# Patient Record
Sex: Female | Born: 1935 | Race: White | Hispanic: No | Smoking: Never smoker
Health system: Southern US, Community
[De-identification: ages and names within clinical notes are randomized; demographics above are authoritative.]

## PROBLEM LIST (undated history)

## (undated) DIAGNOSIS — M1990 Unspecified osteoarthritis, unspecified site: Secondary | ICD-10-CM

## (undated) DIAGNOSIS — F341 Dysthymic disorder: Secondary | ICD-10-CM

## (undated) DIAGNOSIS — M6530 Trigger finger, unspecified finger: Secondary | ICD-10-CM

## (undated) DIAGNOSIS — F411 Generalized anxiety disorder: Secondary | ICD-10-CM

## (undated) DIAGNOSIS — M810 Age-related osteoporosis without current pathological fracture: Secondary | ICD-10-CM

## (undated) DIAGNOSIS — E039 Hypothyroidism, unspecified: Secondary | ICD-10-CM

## (undated) DIAGNOSIS — R739 Hyperglycemia, unspecified: Secondary | ICD-10-CM

## (undated) DIAGNOSIS — I440 Atrioventricular block, first degree: Secondary | ICD-10-CM

## (undated) DIAGNOSIS — I10 Essential (primary) hypertension: Secondary | ICD-10-CM

## (undated) DIAGNOSIS — C50919 Malignant neoplasm of unspecified site of unspecified female breast: Secondary | ICD-10-CM

## (undated) DIAGNOSIS — K5900 Constipation, unspecified: Secondary | ICD-10-CM

## (undated) DIAGNOSIS — E559 Vitamin D deficiency, unspecified: Secondary | ICD-10-CM

## (undated) HISTORY — DX: Essential (primary) hypertension: I10

## (undated) HISTORY — DX: Dysthymic disorder: F34.1

## (undated) HISTORY — DX: Generalized anxiety disorder: F41.1

## (undated) HISTORY — DX: Age-related osteoporosis without current pathological fracture: M81.0

## (undated) HISTORY — DX: Unspecified osteoarthritis, unspecified site: M19.90

## (undated) HISTORY — DX: Constipation, unspecified: K59.00

## (undated) HISTORY — DX: Malignant neoplasm of unspecified site of unspecified female breast: C50.919

## (undated) HISTORY — DX: Hyperglycemia, unspecified: R73.9

## (undated) HISTORY — DX: Atrioventricular block, first degree: I44.0

## (undated) HISTORY — DX: Trigger finger, unspecified finger: M65.30

## (undated) HISTORY — DX: Vitamin D deficiency, unspecified: E55.9

## (undated) HISTORY — DX: Hypothyroidism, unspecified: E03.9

---

## 1960-05-20 HISTORY — PX: ORIF FACIAL FRACTURE: SHX2118

## 1984-05-20 HISTORY — PX: MASTECTOMY: SHX3

## 1997-10-18 LAB — HM SIGMOIDOSCOPY

## 2000-01-08 ENCOUNTER — Encounter: Payer: Self-pay | Admitting: Internal Medicine

## 2000-01-08 ENCOUNTER — Encounter: Admission: RE | Admit: 2000-01-08 | Discharge: 2000-01-08 | Payer: Self-pay | Admitting: Internal Medicine

## 2000-03-25 ENCOUNTER — Other Ambulatory Visit: Admission: RE | Admit: 2000-03-25 | Discharge: 2000-03-25 | Payer: Self-pay | Admitting: Internal Medicine

## 2000-05-20 HISTORY — PX: CATARACT EXTRACTION BILATERAL W/ ANTERIOR VITRECTOMY: SHX1304

## 2000-06-06 LAB — HM DEXA SCAN

## 2001-01-16 ENCOUNTER — Encounter: Admission: RE | Admit: 2001-01-16 | Discharge: 2001-01-16 | Payer: Self-pay | Admitting: Internal Medicine

## 2001-01-16 ENCOUNTER — Encounter: Payer: Self-pay | Admitting: Internal Medicine

## 2005-08-13 ENCOUNTER — Encounter: Admission: RE | Admit: 2005-08-13 | Discharge: 2005-08-13 | Payer: Self-pay | Admitting: Internal Medicine

## 2006-09-16 ENCOUNTER — Encounter: Admission: RE | Admit: 2006-09-16 | Discharge: 2006-09-16 | Payer: Self-pay | Admitting: Internal Medicine

## 2007-09-18 ENCOUNTER — Encounter: Admission: RE | Admit: 2007-09-18 | Discharge: 2007-09-18 | Payer: Self-pay | Admitting: Internal Medicine

## 2008-09-26 ENCOUNTER — Encounter: Admission: RE | Admit: 2008-09-26 | Discharge: 2008-09-26 | Payer: Self-pay | Admitting: Internal Medicine

## 2008-09-26 LAB — HM MAMMOGRAPHY: HM Mammogram: NEGATIVE

## 2009-01-30 DIAGNOSIS — R739 Hyperglycemia, unspecified: Secondary | ICD-10-CM | POA: Insufficient documentation

## 2009-01-30 HISTORY — DX: Hyperglycemia, unspecified: R73.9

## 2011-07-31 DIAGNOSIS — H04129 Dry eye syndrome of unspecified lacrimal gland: Secondary | ICD-10-CM | POA: Diagnosis not present

## 2011-07-31 DIAGNOSIS — H40019 Open angle with borderline findings, low risk, unspecified eye: Secondary | ICD-10-CM | POA: Diagnosis not present

## 2012-01-30 DIAGNOSIS — Z23 Encounter for immunization: Secondary | ICD-10-CM | POA: Diagnosis not present

## 2012-06-02 DIAGNOSIS — I10 Essential (primary) hypertension: Secondary | ICD-10-CM | POA: Diagnosis not present

## 2012-06-02 DIAGNOSIS — C50919 Malignant neoplasm of unspecified site of unspecified female breast: Secondary | ICD-10-CM | POA: Diagnosis not present

## 2012-06-02 DIAGNOSIS — E039 Hypothyroidism, unspecified: Secondary | ICD-10-CM | POA: Diagnosis not present

## 2012-06-02 DIAGNOSIS — E559 Vitamin D deficiency, unspecified: Secondary | ICD-10-CM | POA: Diagnosis not present

## 2012-09-25 ENCOUNTER — Other Ambulatory Visit: Payer: Self-pay | Admitting: *Deleted

## 2012-09-25 DIAGNOSIS — M109 Gout, unspecified: Secondary | ICD-10-CM

## 2012-09-25 DIAGNOSIS — I10 Essential (primary) hypertension: Secondary | ICD-10-CM

## 2012-09-25 DIAGNOSIS — E039 Hypothyroidism, unspecified: Secondary | ICD-10-CM

## 2012-09-28 DIAGNOSIS — H353190 Nonexudative age-related macular degeneration, unspecified eye, stage unspecified: Secondary | ICD-10-CM | POA: Diagnosis not present

## 2012-09-28 DIAGNOSIS — Z961 Presence of intraocular lens: Secondary | ICD-10-CM | POA: Diagnosis not present

## 2012-10-05 ENCOUNTER — Ambulatory Visit (INDEPENDENT_AMBULATORY_CARE_PROVIDER_SITE_OTHER): Payer: Medicare Other | Admitting: Nurse Practitioner

## 2012-10-05 ENCOUNTER — Encounter: Payer: Self-pay | Admitting: Nurse Practitioner

## 2012-10-05 VITALS — BP 124/82 | HR 68 | Temp 98.6°F | Resp 14 | Wt 145.8 lb

## 2012-10-05 DIAGNOSIS — L723 Sebaceous cyst: Secondary | ICD-10-CM | POA: Diagnosis not present

## 2012-10-05 MED ORDER — SACCHAROMYCES BOULARDII 250 MG PO CAPS: 250.0000 mg | ORAL_CAPSULE | Freq: Two times a day (BID) | ORAL | Status: DC

## 2012-10-05 MED ORDER — CEPHALEXIN 250 MG PO CAPS: 250.0000 mg | ORAL_CAPSULE | Freq: Two times a day (BID) | ORAL | Status: DC

## 2012-10-05 NOTE — Patient Instructions (Addendum)
Take medications twice daily for 10 days May use heating pad (low heat) to area twice daily Please follow up in 2 weeks or before if needed or if fever occurs   Epodermal Cyst An epidermal cyst is sometimes called a sebaceous cyst, epidermal inclusion cyst, or infundibular cyst. These cysts usually contain a substance that looks "pasty" or "cheesy" and may have a bad smell. This substance is a protein called keratin. Epidermal cysts are usually found on the face, neck, or trunk. They may also occur in the vaginal area or other parts of the genitalia of both men and women. Epidermal cysts are usually small, painless, slow-growing bumps or lumps that move freely under the skin. It is important not to try to pop them. This may cause an infection and lead to tenderness and swelling. CAUSES  Epidermal cysts may be caused by a deep penetrating injury to the skin or a plugged hair follicle, often associated with acne. SYMPTOMS  Epidermal cysts can become inflamed and cause:  Redness.  Tenderness.  Increased temperature of the skin over the bumps or lumps.  Grayish-white, bad smelling material that drains from the bump or lump. DIAGNOSIS  Epidermal cysts are easily diagnosed by your caregiver during an exam. Rarely, a tissue sample (biopsy) may be taken to rule out other conditions that may resemble epidermal cysts. TREATMENT   Epidermal cysts often get better and disappear on their own. They are rarely ever cancerous.  If a cyst becomes infected, it may become inflamed and tender. This may require opening and draining the cyst. Treatment with antibiotics may be necessary. When the infection is gone, the cyst may be removed with minor surgery.  Small, inflamed cysts can often be treated with antibiotics or by injecting steroid medicines.  Sometimes, epidermal cysts become large and bothersome. If this happens, surgical removal in your caregiver's office may be necessary. HOME CARE  INSTRUCTIONS  Only take over-the-counter or prescription medicines as directed by your caregiver.  Take your antibiotics as directed. Finish them even if you start to feel better. SEEK MEDICAL CARE IF:   Your cyst becomes tender, red, or swollen.  Your condition is not improving or is getting worse.  You have any other questions or concerns. MAKE SURE YOU:  Understand these instructions.  Will watch your condition.  Will get help right away if you are not doing well or get worse. Document Released: 04/06/2004 Document Revised: 07/29/2011 Document Reviewed: 11/12/2010 [facility] Patient Information 2013 [place].

## 2012-10-05 NOTE — Progress Notes (Signed)
Patient ID: Diana Anthony, female   DOB: [birth year], 79 y.o.   MRN: [id number]   Allergies  Allergen Reactions  . Macrobid (Nitrofurantoin Macrocrystal)     Chief Complaint  Patient presents with  . sore on bottom and hurts to sit    HPI: Patient is a 79 y.o. female seen in the office today for painful bump on rectum. Symptoms started 4 days ago and has just gotten worse. Reports she has had a history of hemorrhoids before but this is not it. No blood in stool. No pain when having a BM. Pain when sitting. Has not tried anything because she can not see what is going on.  No fevers or chills   Review of Systems:  Review of Systems  Constitutional: Negative for fever and chills.  Respiratory: Negative for shortness of breath.   Cardiovascular: Negative for chest pain.  Gastrointestinal: Negative for abdominal pain, diarrhea and constipation.  Skin:       Painful area on skin above rectum  Neurological: Negative for weakness.     Past Medical History  Diagnosis Date  . Unspecified vitamin D deficiency   . Other abnormal blood chemistry   . Osteoarthrosis, unspecified whether generalized or localized, unspecified site   . Trigger finger (acquired)   . First degree atrioventricular block   . Anxiety state, unspecified   . Unspecified constipation   . Osteoporosis, unspecified   . Malignant neoplasm of breast (female), unspecified site   . Unspecified hypothyroidism   . Dysthymic disorder   . Unspecified essential hypertension    Past Surgical History  Procedure Laterality Date  . Orif facial fracture  1962  . Mastectomy Left 1986  . Eye surgery     Social History:   reports that she has never smoked. She does not have any smokeless tobacco history on file. She reports that she does not drink alcohol or use illicit drugs.  No family history on file.  Medications: Patient's Medications  New Prescriptions   No medications on file  Previous Medications   AMLODIPINE-BENAZEPRIL (LOTREL) 5-10 MG PER CAPSULE    Take 1 capsule by mouth daily. For blood pressure   CALCIUM CARB-CHOLECALCIFEROL (CALCIUM + D3) 600-200 MG-UNIT TABS    Take one tablet once daily for calcium   CHLORDIAZEPOXIDE (LIBRIUM) 10 MG CAPSULE    Take one capsule daily as needed for rest or nerves   CHOLECALCIFEROL (VITAMIN D) 1000 UNITS TABLET    Take 1,000 Units by mouth daily.   HYDROCHLOROTHIAZIDE (HYDRODIURIL) 25 MG TABLET    Take 25 mg by mouth daily. For bp   LECITHIN 1200 MG CAPS    Take by mouth. Once daily   LEVOTHYROXINE (SYNTHROID, LEVOTHROID) 88 MCG TABLET    Take one tablet by mouth once daily for thyroid   METOPROLOL SUCCINATE (TOPROL-XL) 50 MG 24 HR TABLET    Take 50 mg by mouth daily. To control blood pressure   VITAMIN B-12 (CYANOCOBALAMIN) 100 MCG TABLET    Take 50 mcg by mouth daily.  Modified Medications   No medications on file  Discontinued Medications   No medications on file     Physical Exam:  Filed Vitals:   10/05/12 1014  BP: 124/82  Pulse: 68  Temp: 98.6 F (37 C)  TempSrc: Oral  Resp: 14  Weight: 145 lb 12.8 oz (66.134 kg)    Physical Exam  Constitutional: She appears well-developed and well-nourished. No distress.  Cardiovascular: Normal rate and regular rhythm.  Pulmonary/Chest: Effort normal.  Abdominal: Soft. Bowel sounds are normal.  Genitourinary: Rectum normal.  Skin: She is not diaphoretic.     Red tender flat area above rectum in sacral area No drainage      Assessment/Plan Cyst- Will put on keflex 250 mg PO BID for 10 days with florastore BID Can use heating pad  To follow up in 2 weeks or before if having fever, chills, increased pain, drainage

## 2012-10-15 ENCOUNTER — Other Ambulatory Visit: Payer: Self-pay | Admitting: Geriatric Medicine

## 2012-10-15 MED ORDER — LEVOTHYROXINE SODIUM 88 MCG PO TABS: ORAL_TABLET | ORAL | Status: DC

## 2012-10-26 ENCOUNTER — Encounter: Payer: Self-pay | Admitting: *Deleted

## 2012-10-27 ENCOUNTER — Ambulatory Visit (INDEPENDENT_AMBULATORY_CARE_PROVIDER_SITE_OTHER): Payer: Medicare Other | Admitting: Nurse Practitioner

## 2012-10-27 ENCOUNTER — Encounter: Payer: Self-pay | Admitting: Nurse Practitioner

## 2012-10-27 VITALS — BP 140/86 | HR 78 | Temp 98.6°F | Resp 14 | Ht 60.0 in | Wt 145.0 lb

## 2012-10-27 DIAGNOSIS — L723 Sebaceous cyst: Secondary | ICD-10-CM

## 2012-10-27 NOTE — Progress Notes (Signed)
Patient ID: Diana Anthony, female   DOB: 1916-10-14, 79 y.o.   MRN: [id number]   Allergies  Allergen Reactions  . Macrobid (Nitrofurantoin Macrocrystal)     Chief Complaint  Patient presents with  . 2 wk follow up    sore on bottom    HPI: Patient is a 79 y.o. female seen in the office today for follow up on sebaceous cyst Reports the pain is better. Is able to sit down without it being uncomfortable. Took all antibiotics Rx. Cyst opened up and drained currently no drainage. No fever or chills. Took neosporin as well.   Review of Systems:  Review of Systems  Constitutional: Negative for fever, chills and malaise/fatigue.  Respiratory: Negative for shortness of breath.   Cardiovascular: Negative for chest pain.  Gastrointestinal: Negative for abdominal pain, diarrhea and constipation.  Musculoskeletal: Negative for myalgias.  Skin: Negative for itching and rash.  Neurological: Negative for weakness.     Past Medical History  Diagnosis Date  . Unspecified vitamin D deficiency   . Other abnormal blood chemistry   . Osteoarthrosis, unspecified whether generalized or localized, unspecified site   . Trigger finger (acquired)   . First degree atrioventricular block   . Anxiety state, unspecified   . Unspecified constipation   . Osteoporosis, unspecified   . Malignant neoplasm of breast (female), unspecified site   . Unspecified hypothyroidism   . Dysthymic disorder   . Unspecified essential hypertension    Past Surgical History  Procedure Laterality Date  . Orif facial fracture  1962  . Mastectomy Left 1986  . Eye surgery     Social History:   reports that she has never smoked. She does not have any smokeless tobacco history on file. She reports that she does not drink alcohol or use illicit drugs.  No family history on file.  Medications: Patient's Medications  New Prescriptions   No medications on file  Previous Medications   AMLODIPINE-BENAZEPRIL (LOTREL) 5-10  MG PER CAPSULE    Take 1 capsule by mouth daily. For blood pressure   CALCIUM CARB-CHOLECALCIFEROL (CALCIUM + D3) 600-200 MG-UNIT TABS    Take one tablet once daily for calcium   CEPHALEXIN (KEFLEX) 250 MG CAPSULE    Take 1 capsule (250 mg total) by mouth 2 (two) times daily.   CHLORDIAZEPOXIDE (LIBRIUM) 10 MG CAPSULE    Take one capsule daily as needed for rest or nerves   CHOLECALCIFEROL (VITAMIN D) 1000 UNITS TABLET    Take 1,000 Units by mouth daily.   HYDROCHLOROTHIAZIDE (HYDRODIURIL) 25 MG TABLET    Take 25 mg by mouth daily. For bp   LECITHIN 1200 MG CAPS    Take by mouth. Once daily   LEVOTHYROXINE (SYNTHROID, LEVOTHROID) 88 MCG TABLET    Take one tablet by mouth once daily for thyroid.   LEVOTHYROXINE (SYNTHROID, LEVOTHROID) 88 MCG TABLET    Take one tablet by mouth once daily for thyroid   METOPROLOL SUCCINATE (TOPROL-XL) 50 MG 24 HR TABLET    Take 50 mg by mouth daily. To control blood pressure   SACCHAROMYCES BOULARDII (FLORASTOR) 250 MG CAPSULE    Take 1 capsule (250 mg total) by mouth 2 (two) times daily.   VITAMIN B-12 (CYANOCOBALAMIN) 100 MCG TABLET    Take 50 mcg by mouth daily.  Modified Medications   No medications on file  Discontinued Medications   No medications on file     Physical Exam:  Filed Vitals:   10/27/12 1113  BP: 140/86  Pulse: 78  Temp: 98.6 F (37 C)  TempSrc: Oral  Resp: 14  Height: 5' (1.524 m)  Weight: 145 lb (65.772 kg)  SpO2: 96%    Physical Exam  Vitals reviewed. Constitutional: She appears well-developed and well-nourished. No distress.  Cardiovascular: Normal rate, regular rhythm and normal heart sounds.   Pulmonary/Chest: Effort normal and breath sounds normal.  Abdominal: Soft. Bowel sounds are normal.  Skin: Skin is warm and dry. She is not diaphoretic.  Cyst has improved area no longer red or tender  Psychiatric: She has a normal mood and affect.       Assessment/Plan Cyst- has resolved- to follow up as needed

## 2012-11-27 ENCOUNTER — Other Ambulatory Visit: Payer: Medicare Other

## 2012-11-27 DIAGNOSIS — I10 Essential (primary) hypertension: Secondary | ICD-10-CM

## 2012-11-27 DIAGNOSIS — E039 Hypothyroidism, unspecified: Secondary | ICD-10-CM | POA: Diagnosis not present

## 2012-11-27 DIAGNOSIS — M109 Gout, unspecified: Secondary | ICD-10-CM

## 2012-11-28 LAB — COMPREHENSIVE METABOLIC PANEL
ALT: 11 IU/L (ref 0–32)
AST: 21 IU/L (ref 0–40)
Albumin/Globulin Ratio: 1.6 (ref 1.1–2.5)
Alkaline Phosphatase: 72 IU/L (ref 39–117)
CO2: 23 mmol/L (ref 18–29)
Calcium: 9.5 mg/dL (ref 8.6–10.2)
GFR calc non Af Amer: 33 mL/min/{1.73_m2} — ABNORMAL LOW (ref >59)
Potassium: 4.3 mmol/L (ref 3.5–5.2)
Sodium: 140 mmol/L (ref 134–144)

## 2012-11-28 LAB — LIPID PANEL
Chol/HDL Ratio: 3 ratio units (ref 0.0–4.4)
HDL: 54 mg/dL (ref >39)
VLDL Cholesterol Cal: 25 mg/dL (ref 5–40)

## 2012-11-28 LAB — TSH: TSH: 3.04 u[IU]/mL (ref 0.450–4.500)

## 2012-11-30 ENCOUNTER — Encounter: Payer: Self-pay | Admitting: *Deleted

## 2012-12-01 ENCOUNTER — Encounter: Payer: Self-pay | Admitting: Internal Medicine

## 2012-12-01 ENCOUNTER — Ambulatory Visit (INDEPENDENT_AMBULATORY_CARE_PROVIDER_SITE_OTHER): Payer: Medicare Other | Admitting: Internal Medicine

## 2012-12-01 VITALS — BP 138/84 | HR 84 | Temp 98.1°F | Resp 13 | Ht 60.0 in | Wt 149.0 lb

## 2012-12-01 DIAGNOSIS — E559 Vitamin D deficiency, unspecified: Secondary | ICD-10-CM | POA: Insufficient documentation

## 2012-12-01 DIAGNOSIS — Z853 Personal history of malignant neoplasm of breast: Secondary | ICD-10-CM | POA: Insufficient documentation

## 2012-12-01 DIAGNOSIS — C50919 Malignant neoplasm of unspecified site of unspecified female breast: Secondary | ICD-10-CM

## 2012-12-01 DIAGNOSIS — L0232 Furuncle of buttock: Secondary | ICD-10-CM

## 2012-12-01 DIAGNOSIS — R7309 Other abnormal glucose: Secondary | ICD-10-CM

## 2012-12-01 DIAGNOSIS — E039 Hypothyroidism, unspecified: Secondary | ICD-10-CM

## 2012-12-01 DIAGNOSIS — F411 Generalized anxiety disorder: Secondary | ICD-10-CM | POA: Diagnosis not present

## 2012-12-01 DIAGNOSIS — E785 Hyperlipidemia, unspecified: Secondary | ICD-10-CM

## 2012-12-01 DIAGNOSIS — L0233 Carbuncle of buttock: Secondary | ICD-10-CM

## 2012-12-01 DIAGNOSIS — R739 Hyperglycemia, unspecified: Secondary | ICD-10-CM

## 2012-12-01 DIAGNOSIS — I10 Essential (primary) hypertension: Secondary | ICD-10-CM

## 2012-12-01 MED ORDER — TRIAMCINOLONE ACETONIDE 0.1 % EX CREA: TOPICAL_CREAM | CUTANEOUS | Status: DC

## 2012-12-01 MED ORDER — LEVOTHYROXINE SODIUM 88 MCG PO TABS: ORAL_TABLET | ORAL | Status: DC

## 2012-12-01 NOTE — Patient Instructions (Signed)
Continue current medications. 

## 2012-12-01 NOTE — Progress Notes (Signed)
Subjective:    Patient ID: Diana Anthony, female    DOB: 10-11-1916, 79 y.o.   MRN: [id number]  HPI Had a cyst on her her buttocks. Started over a month ago. Now better. Neice opened it up. Used triamcinolone acetonide 0.1% cream. Would like a refill.  Unspecified vitamin D deficiency: stable  Anxiety state, unspecified: chronic. Unchanged.  Malignant neoplasm of breast (female), unspecified site: remote history. No relapse.  Unspecified hypothyroidism: stable  Unspecified essential hypertension: controlled  Hyperglycemia: controlled  Boil of buttock : resolved  - Plan: triamcinolone cream (KENALOG) 0.1 %   Current Outpatient Prescriptions on File Prior to Visit  Medication Sig Dispense Refill  . amLODipine-benazepril (LOTREL) 5-10 MG per capsule Take 1 capsule by mouth daily. For blood pressure      . Calcium Carb-Cholecalciferol (CALCIUM + D3) 600-200 MG-UNIT TABS Take one tablet once daily for calcium      . chlordiazePOXIDE (LIBRIUM) 10 MG capsule Take one capsule daily as needed for rest or nerves      . cholecalciferol (VITAMIN D) 1000 UNITS tablet Take 1,000 Units by mouth daily.      . hydrochlorothiazide (HYDRODIURIL) 25 MG tablet Take 25 mg by mouth daily. For bp      . Lecithin 1200 MG CAPS Take by mouth. Once daily      . levothyroxine (SYNTHROID, LEVOTHROID) 88 MCG tablet Take one tablet by mouth once daily for thyroid.  90 tablet  3  . levothyroxine (SYNTHROID, LEVOTHROID) 88 MCG tablet Take one tablet by mouth once daily for thyroid  90 tablet  3  . metoprolol succinate (TOPROL-XL) 50 MG 24 hr tablet Take 50 mg by mouth daily. To control blood pressure      . vitamin B-12 (CYANOCOBALAMIN) 100 MCG tablet Take 50 mcg by mouth daily.            Review of Systems  Constitutional: Negative.   HENT: Negative.   Eyes: Negative.   Respiratory: Negative.   Cardiovascular: Negative.  Negative for chest pain, palpitations and leg swelling.  Gastrointestinal: Positive  for constipation.  Endocrine: Negative for cold intolerance, heat intolerance, polydipsia, polyphagia and polyuria.       Elevated glucose to 120 in 2010.  Genitourinary:       Leakage.  Musculoskeletal: Negative.   Skin: Negative.   Allergic/Immunologic: Negative.   Neurological: Negative.   Hematological: Negative.   Psychiatric/Behavioral: Positive for sleep disturbance and dysphoric mood. The patient is nervous/anxious.        Objective:BP 138/84  Pulse 84  Temp(Src) 98.1 F (36.7 C) (Oral)  Resp 13  Ht 5' (1.524 m)  Wt 149 lb (67.586 kg)  BMI 29.1 kg/m2    Physical Exam  Constitutional: She is oriented to person, place, and time. She appears well-nourished. No distress.  Frail.  HENT:  Head: Normocephalic and atraumatic.  Right Ear: External ear normal.  Nose: Nose normal.  Eyes:  Corrective lenses.  Neck: Neck supple. No JVD present. No tracheal deviation present. No thyromegaly present.  Cardiovascular: Normal rate, regular rhythm, normal heart sounds and intact distal pulses.  Exam reveals no gallop and no friction rub.   No murmur heard. Varicose veins  Pulmonary/Chest: Effort normal and breath sounds normal. No respiratory distress. She has no wheezes. She has no rales. She exhibits no tenderness.  Left mastectomy  Abdominal: Bowel sounds are normal. She exhibits no distension and no mass. There is no tenderness.  Musculoskeletal: Normal range of motion. She  exhibits edema. She exhibits no tenderness.  Diffuse OA changes in hands.  Lymphadenopathy:    She has no cervical adenopathy.  Neurological: She is alert and oriented to person, place, and time. No cranial nerve deficit. Coordination normal.  Skin: No rash noted. No erythema. No pallor.  Psychiatric: She has a normal mood and affect. Her behavior is normal. Thought content normal.       Assessment & Plan:  Unspecified vitamin D deficiency: continue supplements  Anxiety state, unspecified:  unuchanged  Malignant neoplasm of breast (female), unspecified site: non relapsse  Unspecified hypothyroidism - Plan: levothyroxine (SYNTHROID, LEVOTHROID) 88 MCG tablet, TSH  Unspecified essential hypertension - Plan: Comprehensive metabolic panel  Hyperglycemia - Plan: Hemoglobin A1c  Boil of buttock - Plan: triamcinolone cream (KENALOG) 0.1 %  Other and unspecified hyperlipidemia - Plan: Lipid panel

## 2012-12-16 ENCOUNTER — Other Ambulatory Visit: Payer: Self-pay | Admitting: Internal Medicine

## 2012-12-16 ENCOUNTER — Encounter: Payer: Self-pay | Admitting: Internal Medicine

## 2012-12-16 ENCOUNTER — Ambulatory Visit (INDEPENDENT_AMBULATORY_CARE_PROVIDER_SITE_OTHER): Payer: Medicare Other | Admitting: Internal Medicine

## 2012-12-16 VITALS — BP 112/62 | HR 59 | Temp 98.0°F | Resp 14 | Ht 60.0 in | Wt 148.0 lb

## 2012-12-16 DIAGNOSIS — N390 Urinary tract infection, site not specified: Secondary | ICD-10-CM

## 2012-12-16 LAB — POCT URINALYSIS DIPSTICK
Blood, UA: 50
Glucose, UA: NEGATIVE
Nitrite, UA: NEGATIVE
Protein, UA: 30
Urobilinogen, UA: NEGATIVE

## 2012-12-16 MED ORDER — SULFAMETHOXAZOLE-TMP DS 800-160 MG PO TABS: 1.0000 | ORAL_TABLET | Freq: Two times a day (BID) | ORAL | Status: DC

## 2012-12-16 MED ORDER — PHENAZOPYRIDINE HCL 100 MG PO TABS: 100.0000 mg | ORAL_TABLET | Freq: Two times a day (BID) | ORAL | Status: DC | PRN

## 2012-12-16 MED ORDER — SACCHAROMYCES BOULARDII 250 MG PO CAPS: 250.0000 mg | ORAL_CAPSULE | Freq: Two times a day (BID) | ORAL | Status: DC

## 2012-12-16 NOTE — Progress Notes (Signed)
Patient ID: MIRABELLE DACY, female   DOB: May 23, 1916, 79 y.o.   MRN: [id number]  Chief Complaint  Patient presents with  . Urinary Tract Infection   Allergies  Allergen Reactions  . Macrobid (Nitrofurantoin Macrocrystal)     HPI- 79 y/o female patient is here with complaints of burning and dysuria with urination for 4 days. She denies any  Hematuria or abdominal pain. No nausea or vomiting Appetite is good  Ros- No fever or chills Has increased urinary frequency No problem with bowel movement No confusion or headache No falls No flank pain  BP 112/62  Pulse 59  Temp(Src) 98 F (36.7 C) (Oral)  Resp 14  Ht 5' (1.524 m)  Wt 148 lb (67.132 kg)  BMI 28.9 kg/m2  gen- elderly female in NAD HEENT- no pallor, no icteurs, mmm cvs- n s1,s2, rrr abdo- bs+, soft, non tender, no flank pain, no suprapubic tenderness Ext- able to move all 4, uses a cane  Labs- U/a- cloudy, amber, wbc > 75 with 30 rbc Sent for cultrue  A/p-  uti- will start her on bactrim ds 1 tab q12h for a week, send urine culture. Encourage hydration and will also provide cranberry capsule daily to prevent recurrence.to take florastor for probiotic and will provide pyridium bid prn with meals to help with dysuria. Reassess if no imporvement

## 2012-12-16 NOTE — Addendum Note (Signed)
Addended by: Jackelyn Knife F on: 12/16/2012 04:50 PM   Modules accepted: Orders

## 2012-12-16 NOTE — Addendum Note (Signed)
Addended by: Jearld Adjutant on: 12/16/2012 04:52 PM   Modules accepted: Orders

## 2012-12-18 LAB — URINE CULTURE

## 2013-01-28 DIAGNOSIS — Z23 Encounter for immunization: Secondary | ICD-10-CM | POA: Diagnosis not present

## 2013-02-15 ENCOUNTER — Ambulatory Visit: Payer: Medicare Other | Admitting: Internal Medicine

## 2013-02-18 ENCOUNTER — Other Ambulatory Visit: Payer: Self-pay | Admitting: *Deleted

## 2013-06-03 ENCOUNTER — Other Ambulatory Visit: Payer: Self-pay | Admitting: Internal Medicine

## 2013-06-18 ENCOUNTER — Other Ambulatory Visit: Payer: Medicare Other

## 2013-06-18 DIAGNOSIS — E039 Hypothyroidism, unspecified: Secondary | ICD-10-CM | POA: Diagnosis not present

## 2013-06-18 DIAGNOSIS — E785 Hyperlipidemia, unspecified: Secondary | ICD-10-CM | POA: Diagnosis not present

## 2013-06-18 DIAGNOSIS — I10 Essential (primary) hypertension: Secondary | ICD-10-CM

## 2013-06-18 DIAGNOSIS — R739 Hyperglycemia, unspecified: Secondary | ICD-10-CM

## 2013-06-18 DIAGNOSIS — R7309 Other abnormal glucose: Secondary | ICD-10-CM | POA: Diagnosis not present

## 2013-06-19 LAB — COMPREHENSIVE METABOLIC PANEL
A/G RATIO: 1.6 (ref 1.1–2.5)
ALBUMIN: 4.4 g/dL (ref 3.2–4.6)
ALK PHOS: 84 IU/L (ref 39–117)
ALT: 10 IU/L (ref 0–32)
AST: 22 IU/L (ref 0–40)
BILIRUBIN TOTAL: 0.4 mg/dL (ref 0.0–1.2)
BUN / CREAT RATIO: 15 (ref 11–26)
BUN: 23 mg/dL (ref 10–36)
CO2: 25 mmol/L (ref 18–29)
CREATININE: 1.54 mg/dL — AB (ref 0.57–1.00)
Calcium: 9.8 mg/dL (ref 8.7–10.3)
Chloride: 99 mmol/L (ref 97–108)
GFR calc non Af Amer: 29 mL/min/{1.73_m2} — ABNORMAL LOW (ref >59)
GFR, EST AFRICAN AMERICAN: 33 mL/min/{1.73_m2} — AB (ref >59)
GLOBULIN, TOTAL: 2.8 g/dL (ref 1.5–4.5)
Glucose: 88 mg/dL (ref 65–99)
Potassium: 3.8 mmol/L (ref 3.5–5.2)
Sodium: 143 mmol/L (ref 134–144)
Total Protein: 7.2 g/dL (ref 6.0–8.5)

## 2013-06-19 LAB — TSH: TSH: 3.77 u[IU]/mL (ref 0.450–4.500)

## 2013-06-19 LAB — LIPID PANEL
CHOLESTEROL TOTAL: 186 mg/dL (ref 100–199)
Chol/HDL Ratio: 3 ratio units (ref 0.0–4.4)
HDL: 61 mg/dL (ref >39)
LDL Calculated: 99 mg/dL (ref 0–99)
TRIGLYCERIDES: 129 mg/dL (ref 0–149)
VLDL Cholesterol Cal: 26 mg/dL (ref 5–40)

## 2013-06-19 LAB — HEMOGLOBIN A1C
Est. average glucose Bld gHb Est-mCnc: 128 mg/dL
Hgb A1c MFr Bld: 6.1 % — ABNORMAL HIGH (ref 4.8–5.6)

## 2013-06-22 ENCOUNTER — Ambulatory Visit (INDEPENDENT_AMBULATORY_CARE_PROVIDER_SITE_OTHER): Payer: Medicare Other | Admitting: Internal Medicine

## 2013-06-22 VITALS — BP 126/78 | HR 88 | Temp 98.2°F | Wt 149.0 lb

## 2013-06-22 DIAGNOSIS — R7309 Other abnormal glucose: Secondary | ICD-10-CM | POA: Diagnosis not present

## 2013-06-22 DIAGNOSIS — I10 Essential (primary) hypertension: Secondary | ICD-10-CM | POA: Diagnosis not present

## 2013-06-22 DIAGNOSIS — E039 Hypothyroidism, unspecified: Secondary | ICD-10-CM | POA: Diagnosis not present

## 2013-06-22 DIAGNOSIS — R739 Hyperglycemia, unspecified: Secondary | ICD-10-CM

## 2013-06-22 NOTE — Patient Instructions (Signed)
Continue current medications. 

## 2013-06-22 NOTE — Progress Notes (Signed)
Patient ID: Diana Anthony, female   DOB: [birth year], 80 y.o.   MRN: [id number]    Location:    [facility]  Place of Service:  OFFICE    Allergies  Allergen Reactions  . Macrobid [Nitrofurantoin Macrocrystal]     Chief Complaint  Patient presents with  . Medical Managment of Chronic Issues    6 month follow-up, labs from 06/18/2013 printed    HPI:  Hyperglycemia - Noted 01/30/2009. Glucose 120mg %.  Unspecified essential hypertension: controlled  Unspecified hypothyroidism: compensated    Medications: Patient's Medications  New Prescriptions   No medications on file  Previous Medications   AMLODIPINE-BENAZEPRIL (LOTREL) 5-10 MG PER CAPSULE    TAKE 1 CAPSULE DAILY TO    CONTROL BLOOD PRESSURE   CALCIUM CARB-CHOLECALCIFEROL (CALCIUM + D3) 600-200 MG-UNIT TABS    Take one tablet once daily for calcium   CHLORDIAZEPOXIDE (LIBRIUM) 10 MG CAPSULE    Take one capsule daily as needed for rest or nerves   CHOLECALCIFEROL (VITAMIN D) 1000 UNITS TABLET    Take 1,000 Units by mouth daily.   HYDROCHLOROTHIAZIDE (HYDRODIURIL) 25 MG TABLET    Take 25 mg by mouth daily. For bp   LECITHIN 1200 MG CAPS    Take by mouth. Once daily   LEVOTHYROXINE (SYNTHROID, LEVOTHROID) 88 MCG TABLET    Take one tablet by mouth once daily for thyroid   METOPROLOL SUCCINATE (TOPROL-XL) 50 MG 24 HR TABLET    Take 50 mg by mouth daily. To control blood pressure   TRIAMCINOLONE CREAM (KENALOG) 0.1 %    Apply twice daily as needed to irritated area.   VITAMIN B-12 (CYANOCOBALAMIN) 100 MCG TABLET    Take 50 mcg by mouth daily.  Modified Medications   No medications on file  Discontinued Medications   PHENAZOPYRIDINE (PYRIDIUM) 100 MG TABLET    Take 1 tablet (100 mg total) by mouth 2 (two) times daily as needed for pain.   SACCHAROMYCES BOULARDII (FLORASTOR) 250 MG CAPSULE    Take 1 capsule (250 mg total) by mouth 2 (two) times daily.   SULFAMETHOXAZOLE-TRIMETHOPRIM (BACTRIM DS) 800-160 MG PER TABLET    Take 1 tablet by  mouth 2 (two) times daily.     Review of Systems  Constitutional: Negative.   HENT: Negative.   Eyes: Negative.   Respiratory: Negative.   Cardiovascular: Negative.  Negative for chest pain, palpitations and leg swelling.  Gastrointestinal: Positive for constipation.  Endocrine: Negative for cold intolerance, heat intolerance, polydipsia, polyphagia and polyuria.       Elevated glucose to 120 in 2010.  Genitourinary:       Leakage.  Musculoskeletal: Negative.   Skin: Negative.   Allergic/Immunologic: Negative.   Neurological: Negative.   Hematological: Negative.   Psychiatric/Behavioral: Positive for sleep disturbance and dysphoric mood. The patient is nervous/anxious.     Filed Vitals:   06/22/13 1326  BP: 126/78  Pulse: 88  Temp: 98.2 F (36.8 C)  TempSrc: Oral  Weight: 149 lb (67.586 kg)  SpO2: 95%   Physical Exam  Constitutional: She is oriented to person, place, and time. She appears well-nourished. No distress.  Frail.  HENT:  Head: Normocephalic and atraumatic.  Right Ear: External ear normal.  Nose: Nose normal.  Eyes:  Corrective lenses.  Neck: Neck supple. No JVD present. No tracheal deviation present. No thyromegaly present.  Cardiovascular: Normal rate, regular rhythm, normal heart sounds and intact distal pulses.  Exam reveals no gallop and no friction rub.  No murmur heard. Varicose veins  Pulmonary/Chest: Effort normal and breath sounds normal. No respiratory distress. She has no wheezes. She has no rales. She exhibits no tenderness.  Left mastectomy  Abdominal: Bowel sounds are normal. She exhibits no distension and no mass. There is no tenderness.  Musculoskeletal: Normal range of motion. She exhibits no edema and no tenderness.  Diffuse OA changes in hands.  Lymphadenopathy:    She has no cervical adenopathy.  Neurological: She is alert and oriented to person, place, and time. No cranial nerve deficit. Coordination normal.  Skin: No rash noted.  No erythema. No pallor.  Psychiatric: She has a normal mood and affect. Her behavior is normal. Thought content normal.     Labs reviewed: Office Visit on 06/22/2013  Component Date Value Range Status  . HM Mammogram 09/26/2008 Neg    Final  . HM Sigmoidoscopy 10/18/1997 Dr.Ladarius Seubert-Internal Hemorrhoids   Final  . HM Dexa Scan 06/06/2000 Mild Osteoporosis   Final  Appointment on 06/18/2013  Component Date Value Range Status  . Cholesterol, Total 06/18/2013 186  100 - 199 mg/dL Final  . Triglycerides 06/18/2013 129  0 - 149 mg/dL Final  . HDL 06/18/2013 61  >39 mg/dL Final   Comment: According to ATP-III Guidelines, HDL-C >59 mg/dL is considered a                          negative risk factor for CHD.  Marland Kitchen VLDL Cholesterol Cal 06/18/2013 26  5 - 40 mg/dL Final  . LDL Calculated 06/18/2013 99  0 - 99 mg/dL Final  . Chol/HDL Ratio 06/18/2013 3.0  0.0 - 4.4 ratio units Final   Comment:                                   T. Chol/HDL Ratio                                                                      Men  Women                                                        1/2 Avg.Risk  3.4    3.3                                                            Avg.Risk  5.0    4.4                                                         2X Avg.Risk  9.6    7.1  3X Avg.Risk 23.4   11.0  . TSH 06/18/2013 3.770  0.450 - 4.500 uIU/mL Final  . Glucose 06/18/2013 88  65 - 99 mg/dL Final  . BUN 06/18/2013 23  10 - 36 mg/dL Final  . Creatinine, Ser 06/18/2013 1.54* 0.57 - 1.00 mg/dL Final  . GFR calc non Af Amer 06/18/2013 29* >59 mL/min/1.73 Final  . GFR calc Af Amer 06/18/2013 33* >59 mL/min/1.73 Final  . BUN/Creatinine Ratio 06/18/2013 15  11 - 26 Final  . Sodium 06/18/2013 143  134 - 144 mmol/L Final  . Potassium 06/18/2013 3.8  3.5 - 5.2 mmol/L Final  . Chloride 06/18/2013 99  97 - 108 mmol/L Final  . CO2 06/18/2013 25  18 - 29 mmol/L Final  .  Calcium 06/18/2013 9.8  8.7 - 10.3 mg/dL Final                 **Please note reference interval change**  . Total Protein 06/18/2013 7.2  6.0 - 8.5 g/dL Final  . Albumin 06/18/2013 4.4  3.2 - 4.6 g/dL Final  . Globulin, Total 06/18/2013 2.8  1.5 - 4.5 g/dL Final  . Albumin/Globulin Ratio 06/18/2013 1.6  1.1 - 2.5 Final  . Total Bilirubin 06/18/2013 0.4  0.0 - 1.2 mg/dL Final  . Alkaline Phosphatase 06/18/2013 84  39 - 117 IU/L Final  . AST 06/18/2013 22  0 - 40 IU/L Final  . ALT 06/18/2013 10  0 - 32 IU/L Final  . Hemoglobin A1C 06/18/2013 6.1* 4.8 - 5.6 % Final   Comment:          Increased risk for diabetes: 5.7 - 6.4                                   Diabetes: >6.4                                   Glycemic control for adults with diabetes: <7.0  . Estimated average glucose 06/18/2013 128   Final      Assessment/Plan  Hyperglycemia - Noted 01/30/2009. Glucose 120mg %. Most recent lab is normal  Unspecified essential hypertension: controlled  Unspecified hypothyroidism; compensated

## 2013-11-11 DIAGNOSIS — H353190 Nonexudative age-related macular degeneration, unspecified eye, stage unspecified: Secondary | ICD-10-CM | POA: Diagnosis not present

## 2013-11-11 DIAGNOSIS — Z961 Presence of intraocular lens: Secondary | ICD-10-CM | POA: Diagnosis not present

## 2013-12-02 ENCOUNTER — Other Ambulatory Visit: Payer: Self-pay | Admitting: Internal Medicine

## 2013-12-03 ENCOUNTER — Encounter: Payer: Self-pay | Admitting: *Deleted

## 2013-12-27 ENCOUNTER — Other Ambulatory Visit: Payer: Medicare Other

## 2013-12-27 DIAGNOSIS — I10 Essential (primary) hypertension: Secondary | ICD-10-CM

## 2013-12-27 DIAGNOSIS — R7309 Other abnormal glucose: Secondary | ICD-10-CM | POA: Diagnosis not present

## 2013-12-27 DIAGNOSIS — R739 Hyperglycemia, unspecified: Secondary | ICD-10-CM

## 2013-12-28 LAB — COMPREHENSIVE METABOLIC PANEL
ALBUMIN: 3.9 g/dL (ref 3.2–4.6)
ALK PHOS: 75 IU/L (ref 39–117)
ALT: 11 IU/L (ref 0–32)
AST: 21 IU/L (ref 0–40)
Albumin/Globulin Ratio: 1.6 (ref 1.1–2.5)
BUN / CREAT RATIO: 17 (ref 11–26)
BUN: 23 mg/dL (ref 10–36)
CHLORIDE: 102 mmol/L (ref 97–108)
CO2: 25 mmol/L (ref 18–29)
Calcium: 9.3 mg/dL (ref 8.7–10.3)
Creatinine, Ser: 1.33 mg/dL — ABNORMAL HIGH (ref 0.57–1.00)
GFR calc Af Amer: 39 mL/min/{1.73_m2} — ABNORMAL LOW (ref >59)
GFR calc non Af Amer: 34 mL/min/{1.73_m2} — ABNORMAL LOW (ref >59)
GLUCOSE: 98 mg/dL (ref 65–99)
Globulin, Total: 2.4 g/dL (ref 1.5–4.5)
Potassium: 3.8 mmol/L (ref 3.5–5.2)
Sodium: 141 mmol/L (ref 134–144)
Total Bilirubin: 0.3 mg/dL (ref 0.0–1.2)
Total Protein: 6.3 g/dL (ref 6.0–8.5)

## 2013-12-28 LAB — HEMOGLOBIN A1C
ESTIMATED AVERAGE GLUCOSE: 140 mg/dL
HEMOGLOBIN A1C: 6.5 % — AB (ref 4.8–5.6)

## 2013-12-28 LAB — MICROALBUMIN, URINE: MICROALBUM., U, RANDOM: 34.3 ug/mL — AB (ref 0.0–17.0)

## 2013-12-28 LAB — LIPID PANEL
CHOLESTEROL TOTAL: 187 mg/dL (ref 100–199)
Chol/HDL Ratio: 3.5 ratio units (ref 0.0–4.4)
HDL: 54 mg/dL (ref >39)
LDL Calculated: 109 mg/dL — ABNORMAL HIGH (ref 0–99)
Triglycerides: 119 mg/dL (ref 0–149)
VLDL CHOLESTEROL CAL: 24 mg/dL (ref 5–40)

## 2013-12-29 ENCOUNTER — Ambulatory Visit (INDEPENDENT_AMBULATORY_CARE_PROVIDER_SITE_OTHER): Payer: Medicare Other | Admitting: Internal Medicine

## 2013-12-29 ENCOUNTER — Encounter: Payer: Self-pay | Admitting: Internal Medicine

## 2013-12-29 VITALS — BP 110/74 | HR 66 | Temp 98.4°F | Ht 59.0 in | Wt 153.4 lb

## 2013-12-29 DIAGNOSIS — C50919 Malignant neoplasm of unspecified site of unspecified female breast: Secondary | ICD-10-CM | POA: Diagnosis not present

## 2013-12-29 DIAGNOSIS — R7309 Other abnormal glucose: Secondary | ICD-10-CM

## 2013-12-29 DIAGNOSIS — R739 Hyperglycemia, unspecified: Secondary | ICD-10-CM

## 2013-12-29 DIAGNOSIS — E039 Hypothyroidism, unspecified: Secondary | ICD-10-CM

## 2013-12-29 MED ORDER — LEVOTHYROXINE SODIUM 88 MCG PO TABS: ORAL_TABLET | ORAL | Status: DC

## 2013-12-29 NOTE — Progress Notes (Signed)
Patient ID: Diana Anthony, female   DOB: 10-12-1916, 80 y.o.   MRN: [id number]    Location:    [facility]  Place of Service:  OFFICE  Extended Emergency Contact Information Primary Emergency Contact: Massiah,HOWARD A Address: [place],  [place] Home Phone: [phone] Relation: None   Chief Complaint  Patient presents with  . Annual Exam    Physical, discuss labs, and discuss Genetic Test results    HPI:  Unspecified hypothyroidism -compensated  Malignant neoplasm of breast (female), unspecified site: no relapse  Hyperglycemia: last glucose was normal    Past Medical History  Diagnosis Date  . Unspecified vitamin D deficiency   . Osteoarthrosis, unspecified whether generalized or localized, unspecified site   . Trigger finger (acquired)   . First degree atrioventricular block   . Anxiety state, unspecified   . Unspecified constipation   . Senile osteoporosis   . Malignant neoplasm of breast (female), unspecified site   . Unspecified hypothyroidism   . Dysthymic disorder   . Unspecified essential hypertension   . Hyperglycemia 01/30/09    Past Surgical History  Procedure Laterality Date  . Orif facial fracture  1962  . Mastectomy Left 1986    Dr.Weatherly   . Cataract extraction bilateral w/ anterior vitrectomy Bilateral 2002    History   Social History  . Marital Status: Widowed    Spouse Name: N/A    Number of Children: N/A  . Years of Education: N/A   Occupational History  . Not on file.   Social History Main Topics  . Smoking status: Never Smoker   . Smokeless tobacco: Not on file  . Alcohol Use: No  . Drug Use: No  . Sexual Activity: Not on file   Other Topics Concern  . Not on file   Social History Narrative   Mini-Mental State Exam 02/06/2010-28/30, passed clock drawing (Copied from [place])     reports that she has never smoked. She does not have any smokeless tobacco history on file. She reports that she does not drink  alcohol or use illicit drugs.  Immunization History  Administered Date(s) Administered  . Influenza-Unspecified 02/01/2009, 02/06/2010, 02/12/2011  . Pneumococcal Polysaccharide-23 03/25/2000  . Td 03/25/2000    Allergies  Allergen Reactions  . Macrobid [Nitrofurantoin Macrocrystal]     Medications: Patient's Medications  New Prescriptions   No medications on file  Previous Medications   AMLODIPINE-BENAZEPRIL (LOTREL) 5-10 MG PER CAPSULE    TAKE 1 CAPSULE DAILY TO    CONTROL BLOOD PRESSURE   CALCIUM CARB-CHOLECALCIFEROL (CALCIUM + D3) 600-200 MG-UNIT TABS    Take one tablet once daily for calcium   CHLORDIAZEPOXIDE (LIBRIUM) 10 MG CAPSULE    Take one capsule daily as needed for rest or nerves   CHOLECALCIFEROL (VITAMIN D) 1000 UNITS TABLET    Take 1,000 Units by mouth daily.   HYDROCHLOROTHIAZIDE (HYDRODIURIL) 25 MG TABLET    Take 25 mg by mouth daily. For bp   LECITHIN 1200 MG CAPS    Take by mouth. Once daily   LEVOTHYROXINE (SYNTHROID, LEVOTHROID) 88 MCG TABLET    Take one tablet by mouth once daily for thyroid   METOPROLOL SUCCINATE (TOPROL-XL) 50 MG 24 HR TABLET    TAKE 1 TABLET BY MOUTH EVERY DAY   TRIAMCINOLONE CREAM (KENALOG) 0.1 %    Apply twice daily as needed to irritated area.   VITAMIN B-12 (CYANOCOBALAMIN) 100 MCG  TABLET    Take 50 mcg by mouth daily.  Modified Medications   No medications on file  Discontinued Medications   No medications on file     Review of Systems  Constitutional: Negative.   HENT: Negative.   Eyes: Negative.   Respiratory: Negative.   Cardiovascular: Negative.  Negative for chest pain, palpitations and leg swelling.  Gastrointestinal: Positive for constipation.  Endocrine: Negative for cold intolerance, heat intolerance, polydipsia, polyphagia and polyuria.       Elevated glucose to 120 in 2010.  Genitourinary:       Leakage.  Musculoskeletal: Negative.   Skin: Negative.   Allergic/Immunologic: Negative.   Neurological: Negative.    Hematological: Negative.   Psychiatric/Behavioral: Positive for sleep disturbance and dysphoric mood. The patient is nervous/anxious.     Filed Vitals:   12/29/13 1607  BP: 110/74  Pulse: 66  Temp: 98.4 F (36.9 C)  TempSrc: Oral  Height: 4\' 11"  (1.499 m)  Weight: 153 lb 6.4 oz (69.582 kg)  SpO2: 96%   Body mass index is 30.97 kg/(m^2).  Physical Exam  Constitutional: She is oriented to person, place, and time. She appears well-nourished. No distress.  Frail.  HENT:  Head: Normocephalic and atraumatic.  Right Ear: External ear normal.  Nose: Nose normal.  Eyes:  Corrective lenses.  Neck: Neck supple. No JVD present. No tracheal deviation present. No thyromegaly present.  Cardiovascular: Normal rate, regular rhythm, normal heart sounds and intact distal pulses.  Exam reveals no gallop and no friction rub.   No murmur heard. Varicose veins  Pulmonary/Chest: Effort normal and breath sounds normal. No respiratory distress. She has no wheezes. She has no rales. She exhibits no tenderness.  Left mastectomy  Abdominal: Bowel sounds are normal. She exhibits no distension and no mass. There is no tenderness.  Musculoskeletal: Normal range of motion. She exhibits no edema and no tenderness.  Diffuse OA changes in hands.  Lymphadenopathy:    She has no cervical adenopathy.  Neurological: She is alert and oriented to person, place, and time. No cranial nerve deficit. Coordination normal.  Skin: No rash noted. No erythema. No pallor.  Psychiatric: She has a normal mood and affect. Her behavior is normal. Thought content normal.     Labs reviewed: Appointment on 12/27/2013  Component Date Value Ref Range Status  . Hemoglobin A1C 12/27/2013 6.5* 4.8 - 5.6 % Final   Comment:          Increased risk for diabetes: 5.7 - 6.4                                   Diabetes: >6.4                                   Glycemic control for adults with diabetes: <7.0  . Estimated average glucose  12/27/2013 140   Final  . Glucose 12/27/2013 98  65 - 99 mg/dL Final  . BUN 12/27/2013 23  10 - 36 mg/dL Final  . Creatinine, Ser 12/27/2013 1.33* 0.57 - 1.00 mg/dL Final  . GFR calc non Af Amer 12/27/2013 34* >59 mL/min/1.73 Final  . GFR calc Af Amer 12/27/2013 39* >59 mL/min/1.73 Final  . BUN/Creatinine Ratio 12/27/2013 17  11 - 26 Final  . Sodium 12/27/2013 141  134 - 144 mmol/L Final  . Potassium  12/27/2013 3.8  3.5 - 5.2 mmol/L Final  . Chloride 12/27/2013 102  97 - 108 mmol/L Final  . CO2 12/27/2013 25  18 - 29 mmol/L Final  . Calcium 12/27/2013 9.3  8.7 - 10.3 mg/dL Final  . Total Protein 12/27/2013 6.3  6.0 - 8.5 g/dL Final  . Albumin 12/27/2013 3.9  3.2 - 4.6 g/dL Final  . Globulin, Total 12/27/2013 2.4  1.5 - 4.5 g/dL Final  . Albumin/Globulin Ratio 12/27/2013 1.6  1.1 - 2.5 Final  . Total Bilirubin 12/27/2013 0.3  0.0 - 1.2 mg/dL Final  . Alkaline Phosphatase 12/27/2013 75  39 - 117 IU/L Final  . AST 12/27/2013 21  0 - 40 IU/L Final  . ALT 12/27/2013 11  0 - 32 IU/L Final  . Microalbum.,U,Random 12/27/2013 34.3* 0.0 - 17.0 ug/mL Final  . Cholesterol, Total 12/27/2013 187  100 - 199 mg/dL Final  . Triglycerides 12/27/2013 119  0 - 149 mg/dL Final  . HDL 12/27/2013 54  >39 mg/dL Final   Comment: According to ATP-III Guidelines, HDL-C >59 mg/dL is considered a                          negative risk factor for CHD.  Marland Kitchen VLDL Cholesterol Cal 12/27/2013 24  5 - 40 mg/dL Final  . LDL Calculated 12/27/2013 109* 0 - 99 mg/dL Final  . Chol/HDL Ratio 12/27/2013 3.5  0.0 - 4.4 ratio units Final   Comment:                                   T. Chol/HDL Ratio                                                                      Men  Women                                                        1/2 Avg.Risk  3.4    3.3                                                            Avg.Risk  5.0    4.4                                                         2X Avg.Risk  9.6    7.1  3X Avg.Risk 23.4   11.0      Assessment/Plan  1. Unspecified hypothyroidism - levothyroxine (SYNTHROID, LEVOTHROID) 88 MCG tablet; Take one tablet by mouth once daily for thyroid  Dispense: 90 tablet; Refill: 3  2. Malignant neoplasm of breast (female), unspecified site no relapse  3. Hyperglycemia Most recent lab was normal at 98 mg%

## 2014-01-18 ENCOUNTER — Encounter: Payer: Self-pay | Admitting: Internal Medicine

## 2014-02-04 ENCOUNTER — Other Ambulatory Visit: Payer: Self-pay | Admitting: Internal Medicine

## 2014-02-06 DIAGNOSIS — M25569 Pain in unspecified knee: Secondary | ICD-10-CM | POA: Diagnosis not present

## 2014-02-07 ENCOUNTER — Other Ambulatory Visit: Payer: Self-pay | Admitting: *Deleted

## 2014-02-07 MED ORDER — CHLORDIAZEPOXIDE HCL 10 MG PO CAPS: ORAL_CAPSULE | ORAL | Status: DC

## 2014-02-07 MED ORDER — LEVOTHYROXINE SODIUM 88 MCG PO TABS: ORAL_TABLET | ORAL | Status: DC

## 2014-02-07 MED ORDER — AMLODIPINE BESY-BENAZEPRIL HCL 5-10 MG PO CAPS: ORAL_CAPSULE | ORAL | Status: DC

## 2014-02-07 MED ORDER — HYDROCHLOROTHIAZIDE 25 MG PO TABS: ORAL_TABLET | ORAL | Status: DC

## 2014-02-07 MED ORDER — METOPROLOL SUCCINATE ER 50 MG PO TB24: ORAL_TABLET | ORAL | Status: DC

## 2014-02-07 NOTE — Telephone Encounter (Signed)
Patient daughter Diana Anthony called and stated that patient was wanting to get away from mail order and go with a local pharmacy. Faxed to local pharmacy

## 2014-02-09 ENCOUNTER — Other Ambulatory Visit: Payer: Self-pay | Admitting: Internal Medicine

## 2014-02-18 ENCOUNTER — Encounter: Payer: Self-pay | Admitting: Internal Medicine

## 2014-02-18 ENCOUNTER — Ambulatory Visit
Admission: RE | Admit: 2014-02-18 | Discharge: 2014-02-18 | Disposition: A | Discharge status: Home / Self Care | Payer: Medicare Other | Source: Ambulatory Visit | Attending: Internal Medicine | Admitting: Internal Medicine

## 2014-02-18 ENCOUNTER — Ambulatory Visit (INDEPENDENT_AMBULATORY_CARE_PROVIDER_SITE_OTHER): Payer: Medicare Other | Admitting: Internal Medicine

## 2014-02-18 ENCOUNTER — Other Ambulatory Visit: Payer: Self-pay | Admitting: Internal Medicine

## 2014-02-18 VITALS — BP 124/80 | HR 79 | Temp 98.8°F | Resp 10 | Wt 153.0 lb

## 2014-02-18 DIAGNOSIS — R413 Other amnesia: Secondary | ICD-10-CM | POA: Diagnosis not present

## 2014-02-18 DIAGNOSIS — Z23 Encounter for immunization: Secondary | ICD-10-CM | POA: Diagnosis not present

## 2014-02-18 DIAGNOSIS — M25561 Pain in right knee: Secondary | ICD-10-CM | POA: Diagnosis not present

## 2014-02-18 DIAGNOSIS — M1711 Unilateral primary osteoarthritis, right knee: Secondary | ICD-10-CM

## 2014-02-18 MED ORDER — DICLOFENAC SODIUM 1 % TD GEL: 4.0000 g | Freq: Four times a day (QID) | TRANSDERMAL | Status: DC

## 2014-02-18 NOTE — Progress Notes (Signed)
Patient ID: Diana Anthony, female   DOB: Sep 11, 1916, 80 y.o.   MRN: [id number]   Location:  [facility]   Allergies  Allergen Reactions  . Macrobid [Nitrofurantoin Macrocrystal]     Chief Complaint  Patient presents with  . Acute Visit    Right knee pain (off/on) x 2 weeks, no known injury. Pain increases with walking      HPI: Patient is a 80 y.o.  seen in the office today for acute visit due to right knee pain off and on for 2 wks.  She has had no known injury.  Her pain gets worse when she walks on it.  She was given 14 days of meloxicam at the urgent care.  Feels like a toothache with it being damp--a grumbling toothache.  Says she doesn't think she tried anything before she went.  She has not had any xrays done.  Not sure if did much good.  Feels like it pulls in her knee when she walks.  Had hardly been able to walk on it so her son made this appt.  Not painful at rest.  Has used some otc topical agents on it.    Keeps telling me how she doesn't remember things well. Forgot her meloxicam bottle was setting out on the counter.   Needs workup of this (she's 89 so she probably has some mild cognitive impairment).    She does not have a living will or hcpoa documented.    Review of Systems:  Review of Systems  Constitutional: Negative for fever.  Respiratory: Negative for shortness of breath.   Musculoskeletal: Positive for joint pain. Negative for falls.  Neurological: Negative for dizziness.  Psychiatric/Behavioral: Positive for memory loss.    Past Medical History  Diagnosis Date  . Unspecified vitamin D deficiency   . Osteoarthrosis, unspecified whether generalized or localized, unspecified site   . Trigger finger (acquired)   . First degree atrioventricular block   . Anxiety state, unspecified   . Unspecified constipation   . Senile osteoporosis   . Malignant neoplasm of breast (female), unspecified site   . Unspecified  hypothyroidism   . Dysthymic disorder   . Unspecified essential hypertension   . Hyperglycemia 01/30/09    Past Surgical History  Procedure Laterality Date  . Orif facial fracture  1962  . Mastectomy Left 1986    Dr.Weatherly   . Cataract extraction bilateral w/ anterior vitrectomy Bilateral 2002    Social History:   reports that she has never smoked. She does not have any smokeless tobacco history on file. She reports that she does not drink alcohol or use illicit drugs.  Family History  Problem Relation Age of Onset  . Heart disease Sister   . Emphysema Brother   . Cerebrovascular Accident Mother   . Cerebrovascular Accident Father   . Hypertension Mother     Medications: Patient's Medications  New Prescriptions   No medications on file  Previous Medications   AMLODIPINE-BENAZEPRIL (LOTREL) 5-10 MG PER CAPSULE    Take one capsule once daily to control blood pressure   CALCIUM CARB-CHOLECALCIFEROL (CALCIUM + D3) 600-200 MG-UNIT TABS    Take one tablet once daily for calcium   CHLORDIAZEPOXIDE (LIBRIUM) 10 MG CAPSULE    Take one capsule daily as needed for rest or nerves   CHOLECALCIFEROL (VITAMIN D) 1000 UNITS TABLET    Take 1,000 Units by mouth daily.   HYDROCHLOROTHIAZIDE (HYDRODIURIL) 25 MG TABLET  Take one tablet by mouth once daily for blood pressure   LECITHIN 1200 MG CAPS    Take by mouth. Once daily   LEVOTHYROXINE (SYNTHROID, LEVOTHROID) 88 MCG TABLET    Take one tablet by mouth once daily for thyroid   MELOXICAM (MOBIC) 15 MG TABLET    Take 15 mg by mouth daily.    METOPROLOL SUCCINATE (TOPROL-XL) 50 MG 24 HR TABLET    Take one tablet by mouth once daily   TRIAMCINOLONE CREAM (KENALOG) 0.1 %    Apply twice daily as needed to irritated area.   VITAMIN B-12 (CYANOCOBALAMIN) 100 MCG TABLET    Take 50 mcg by mouth daily.  Modified Medications   No medications on file  Discontinued Medications   LEVOTHYROXINE (SYNTHROID, LEVOTHROID) 88 MCG TABLET    TAKE 1 TABLET  BY MOUTH DAILY FOR THYROID SUPPLEMENT     Physical Exam: Filed Vitals:   02/18/14 0941  BP: 124/80  Pulse: 79  Temp: 98.8 F (37.1 C)  TempSrc: Oral  Resp: 10  Weight: 153 lb (69.4 kg)  SpO2: 96%  Physical Exam  Constitutional: She is oriented to person, place, and time. She appears well-developed and well-nourished. No distress.  Cardiovascular: Normal rate and regular rhythm.   Pulmonary/Chest: Effort normal and breath sounds normal.  Abdominal: Bowel sounds are normal.  Musculoskeletal: She exhibits tenderness.  Tender medial and lateral right knee with effusion (small), difficulty bearing weight on the knee, walking with rollator walker   Neurological: She is alert and oriented to person, place, and time.  Skin: Skin is warm and dry.  Psychiatric: She has a normal mood and affect.    Labs reviewed: Basic Metabolic Panel:  Recent Labs  06/18/13 0935 12/27/13 0837  NA 143 141  K 3.8 3.8  CL 99 102  CO2 25 25  GLUCOSE 88 98  BUN 23 23  CREATININE 1.54* 1.33*  CALCIUM 9.8 9.3  TSH 3.770  --    Liver Function Tests:  Recent Labs  06/18/13 0935 12/27/13 0837  AST 22 21  ALT 10 11  ALKPHOS 84 75  BILITOT 0.4 0.3  PROT 7.2 6.3   No results found for this basename: LIPASE, AMYLASE,  in the last 8760 hours No results found for this basename: AMMONIA,  in the last 8760 hours CBC: No results found for this basename: WBC, NEUTROABS, HGB, HCT, MCV, PLT,  in the last 8760 hours Lipid Panel:  Recent Labs  06/18/13 0935 12/27/13 0837  HDL 61 54  LDLCALC 99 109*  TRIG 129 119  CHOLHDL 3.0 3.5   Lab Results  Component Value Date   HGBA1C 6.5* 12/27/2013    Assessment/Plan 1. Primary osteoarthritis of right knee - suspect this is end stage at this point -she was receiving injections in the knee many years ago by Dr. Manley Mason?   -given voltaren gel today and instructions for heat when not using the gel, elevate - DG Knee Complete 4 Views Right; Future  were not done at urgent care so ordered today--her son is taking her there now  2. Memory loss -needs MMSE at next appt, has some memory difficulty--unsure if she also has some functional losses  3. Encounter for immunization -flu shot given  Labs/tests ordered:  xrays of right knee Next appt:  Keep with Dr. Nyoka Cowden  Dayanis Bergquist L. Alto Gandolfo, D.O. [facility] [address], [place] [address] Cell Phone (Mon-Fri 8am-5pm):  [phone] On  Call:  [phone] & follow prompts after 5pm & weekends Office Phone:  [phone] Office Fax:  [phone]

## 2014-02-18 NOTE — Patient Instructions (Signed)
Let's get xrays of your right knee and try voltaren gel on it 4x per day.  You may also use heat on it when you have NOT used the gel (can burn you if you have the gel on there).  I recommend you complete a living will and hcpoa paperwork if this has not been done.

## 2014-02-24 ENCOUNTER — Encounter: Payer: Self-pay | Admitting: Internal Medicine

## 2014-02-24 ENCOUNTER — Ambulatory Visit (INDEPENDENT_AMBULATORY_CARE_PROVIDER_SITE_OTHER): Payer: Medicare Other | Admitting: Internal Medicine

## 2014-02-24 VITALS — BP 142/84 | HR 78 | Temp 97.6°F | Resp 20 | Ht 59.0 in | Wt 150.4 lb

## 2014-02-24 DIAGNOSIS — M1711 Unilateral primary osteoarthritis, right knee: Secondary | ICD-10-CM | POA: Insufficient documentation

## 2014-02-24 MED ORDER — TRIAMCINOLONE ACETONIDE 40 MG/ML IJ SUSP: 40.0000 mg | Freq: Once | INTRAMUSCULAR | Status: DC

## 2014-02-24 NOTE — Progress Notes (Signed)
Patient ID: Diana Anthony, female   DOB: 28-Nov-1916, 80 y.o.   MRN: [id number]   Location:  [facility]  Allergies  Allergen Reactions  . Macrobid [Nitrofurantoin Macrocrystal]     Chief Complaint  Patient presents with  . Acute Visit    rt knee pain/knee injection    HPI: Patient is a 80 y.o. white female seen in the office today for an acute visit for right knee pain.  She continues to have trouble bearing weight.  It was not helped by tylenol or meloxicam.  She also has tried topical agents, heat and ice w/o benefit.  She has dementia so use of narcotics will worsen confusion and her balance is poor so it will increase her risk of falling.  Her xrays did show significant arthritis of her right knee.  She is interested in a steroid injection for her knee.    Review of Systems:  Review of Systems  Constitutional: Negative for fever and malaise/fatigue.  HENT: Negative for congestion.   Eyes: Negative for blurred vision.  Respiratory: Negative for shortness of breath.   Cardiovascular: Negative for chest pain.  Gastrointestinal: Negative for heartburn.  Musculoskeletal: Positive for joint pain.       Right knee  Skin: Negative for rash.  Neurological: Negative for weakness.  Psychiatric/Behavioral: Positive for memory loss.     Past Medical History  Diagnosis Date  . Unspecified vitamin D deficiency   . Osteoarthrosis, unspecified whether generalized or localized, unspecified site   . Trigger finger (acquired)   . First degree atrioventricular block   . Anxiety state, unspecified   . Unspecified constipation   . Senile osteoporosis   . Malignant neoplasm of breast (female), unspecified site   . Unspecified hypothyroidism   . Dysthymic disorder   . Unspecified essential hypertension   . Hyperglycemia 01/30/09    Past Surgical History  Procedure Laterality Date  . Orif facial fracture  1962  . Mastectomy Left 1986   Dr.Weatherly   . Cataract extraction bilateral w/ anterior vitrectomy Bilateral 2002    Social History:   reports that she has never smoked. She does not have any smokeless tobacco history on file. She reports that she does not drink alcohol or use illicit drugs.  Family History  Problem Relation Age of Onset  . Heart disease Sister   . Emphysema Brother   . Cerebrovascular Accident Mother   . Cerebrovascular Accident Father   . Hypertension Mother     Medications: Patient's Medications  New Prescriptions   No medications on file  Previous Medications   AMLODIPINE-BENAZEPRIL (LOTREL) 5-10 MG PER CAPSULE    Take one capsule once daily to control blood pressure   CALCIUM CARB-CHOLECALCIFEROL (CALCIUM + D3) 600-200 MG-UNIT TABS    Take one tablet once daily for calcium   CHLORDIAZEPOXIDE (LIBRIUM) 10 MG CAPSULE    Take one capsule daily as needed for rest or nerves   CHOLECALCIFEROL (VITAMIN D) 1000 UNITS TABLET    Take 1,000 Units by mouth daily.   DICLOFENAC SODIUM (VOLTAREN) 1 % GEL    Apply 4 g topically 4 (four) times daily. To right knee   HYDROCHLOROTHIAZIDE (HYDRODIURIL) 25 MG TABLET    Take one tablet by mouth once daily for blood pressure   LECITHIN 1200 MG CAPS    Take by mouth. Once daily   LEVOTHYROXINE (SYNTHROID, LEVOTHROID) 88 MCG TABLET    Take one tablet by mouth once daily  for thyroid   METOPROLOL SUCCINATE (TOPROL-XL) 50 MG 24 HR TABLET    Take one tablet by mouth once daily   TRIAMCINOLONE CREAM (KENALOG) 0.1 %    Apply twice daily as needed to irritated area.   VITAMIN B-12 (CYANOCOBALAMIN) 100 MCG TABLET    Take 50 mcg by mouth daily.  Modified Medications   No medications on file  Discontinued Medications   No medications on file     Physical Exam: Filed Vitals:   02/24/14 1116  BP: 142/84  Pulse: 78  Temp: 97.6 F (36.4 C)  TempSrc: Oral  Resp: 20  Height: 4\' 11"  (1.499 m)  Weight: 150 lb 6.4 oz (68.221 kg)  SpO2: 99%  Physical Exam    Constitutional: She appears well-developed and well-nourished.  Cardiovascular: Normal rate and regular rhythm.   Pulmonary/Chest: Effort normal and breath sounds normal. No respiratory distress.  Musculoskeletal:  Right knee tender and swollen, difficulty bearing weight on it, walks with rollator  Neurological: She is alert.  Skin: Skin is warm and dry.    Labs reviewed: Basic Metabolic Panel:  Recent Labs  06/18/13 0935 12/27/13 0837  NA 143 141  K 3.8 3.8  CL 99 102  CO2 25 25  GLUCOSE 88 98  BUN 23 23  CREATININE 1.54* 1.33*  CALCIUM 9.8 9.3  TSH 3.770  --    Liver Function Tests:  Recent Labs  06/18/13 0935 12/27/13 0837  AST 22 21  ALT 10 11  ALKPHOS 84 75  BILITOT 0.4 0.3  PROT 7.2 6.3  Lipid Panel:  Recent Labs  06/18/13 0935 12/27/13 0837  HDL 61 54  LDLCALC 99 109*  TRIG 129 119  CHOLHDL 3.0 3.5   Lab Results  Component Value Date   HGBA1C 6.5* 12/27/2013   Assessment/Plan 1. Primary osteoarthritis of right knee -consent obtained, steroid and anesthetic injected medially in her right knee for severe pain that was interfering with walking and was not relieved with oral or topical agents -she has dementia and will not tolerate narcotics well for pain unless we cannot control her pain any other way  Next appt:  Keep as scheduled  Makail Watling L. Aristide Waggle, D.O. [facility] [address], [place] [address] Cell Phone (Mon-Fri 8am-5pm):  [phone] On Call:  [phone] & follow prompts after 5pm & weekends Office Phone:  [phone] Office Fax:  [phone]

## 2014-05-23 ENCOUNTER — Other Ambulatory Visit: Payer: Self-pay | Admitting: Nurse Practitioner

## 2014-06-08 ENCOUNTER — Other Ambulatory Visit: Payer: Self-pay | Admitting: Internal Medicine

## 2014-06-29 ENCOUNTER — Other Ambulatory Visit: Payer: Self-pay | Admitting: Internal Medicine

## 2014-09-30 DIAGNOSIS — L03031 Cellulitis of right toe: Secondary | ICD-10-CM | POA: Diagnosis not present

## 2014-10-10 ENCOUNTER — Encounter: Payer: Self-pay | Admitting: Nurse Practitioner

## 2014-10-10 ENCOUNTER — Ambulatory Visit (INDEPENDENT_AMBULATORY_CARE_PROVIDER_SITE_OTHER): Payer: Medicare Other | Admitting: Nurse Practitioner

## 2014-10-10 VITALS — BP 130/70 | HR 83 | Temp 99.3°F | Resp 18 | Ht 59.0 in | Wt 146.8 lb

## 2014-10-10 DIAGNOSIS — I10 Essential (primary) hypertension: Secondary | ICD-10-CM | POA: Diagnosis not present

## 2014-10-10 DIAGNOSIS — M1711 Unilateral primary osteoarthritis, right knee: Secondary | ICD-10-CM | POA: Diagnosis not present

## 2014-10-10 DIAGNOSIS — E039 Hypothyroidism, unspecified: Secondary | ICD-10-CM

## 2014-10-10 NOTE — Progress Notes (Signed)
Patient ID: Diana Anthony, female   DOB: 1916-09-05, 81 y.o.   MRN: [id number]    PCP: [name], MD  Allergies  Allergen Reactions  . Macrobid [Nitrofurantoin Macrocrystal]     Chief Complaint  Patient presents with  . Acute Visit    right knee pain     HPI: Patient is a 81 y.o. female seen in the office today due to right knee pain. Pt of Dr Nyoka Cowden who has not had a routine follow up since annual visit 10 months ago.  Hx of OA, no falls or injury to the knee. Has been using Voltaren gel at night for the pain- only using at night not during the day.  Does not take tylenol often.  Son helps her with medication Has not come back for routine problems because nothing is wrong  Review of Systems:  Review of Systems  Constitutional: Negative for fever, activity change, appetite change and fatigue.  HENT: Negative for congestion.   Respiratory: Negative for shortness of breath.   Cardiovascular: Negative for chest pain.  Gastrointestinal: Negative for diarrhea, constipation and abdominal distention.  Musculoskeletal:       Right knee  Skin: Negative for rash.  Neurological: Negative for weakness.  Psychiatric/Behavioral:       Memory loss    Past Medical History  Diagnosis Date  . Unspecified vitamin D deficiency   . Osteoarthrosis, unspecified whether generalized or localized, unspecified site   . Trigger finger (acquired)   . First degree atrioventricular block   . Anxiety state, unspecified   . Unspecified constipation   . Senile osteoporosis   . Malignant neoplasm of breast (female), unspecified site   . Unspecified hypothyroidism   . Dysthymic disorder   . Unspecified essential hypertension   . Hyperglycemia 01/30/09   Past Surgical History  Procedure Laterality Date  . Orif facial fracture  1962  . Mastectomy Left 1986    Dr.Weatherly   . Cataract extraction bilateral w/ anterior vitrectomy Bilateral 2002   Social History:   reports that she has never  smoked. She does not have any smokeless tobacco history on file. She reports that she does not drink alcohol or use illicit drugs.  Family History  Problem Relation Age of Onset  . Heart disease Sister   . Emphysema Brother   . Cerebrovascular Accident Mother   . Cerebrovascular Accident Father   . Hypertension Mother     Medications: Patient's Medications  New Prescriptions   No medications on file  Previous Medications   AMLODIPINE-BENAZEPRIL (LOTREL) 5-10 MG PER CAPSULE    TAKE 1 CAPSULE DAILY TO    CONTROL BLOOD PRESSURE   CALCIUM CARB-CHOLECALCIFEROL (CALCIUM + D3) 600-200 MG-UNIT TABS    Take one tablet once daily for calcium   CHLORDIAZEPOXIDE (LIBRIUM) 10 MG CAPSULE    Take one capsule daily as needed for rest or nerves   CHOLECALCIFEROL (VITAMIN D) 1000 UNITS TABLET    Take 1,000 Units by mouth daily.   DICLOFENAC SODIUM (VOLTAREN) 1 % GEL    Apply 4 g topically 4 (four) times daily. To right knee   HYDROCHLOROTHIAZIDE (HYDRODIURIL) 25 MG TABLET    Take one tablet by mouth once daily for blood pressure   LECITHIN 1200 MG CAPS    Take by mouth. Once daily   LEVOTHYROXINE (SYNTHROID, LEVOTHROID) 88 MCG TABLET    Take one tablet by mouth once daily for thyroid   METOPROLOL SUCCINATE (TOPROL-XL) 50 MG 24 HR TABLET  TAKE 1 TABLET BY MOUTH EVERY DAY   TRIAMCINOLONE CREAM (KENALOG) 0.1 %    Apply twice daily as needed to irritated area.   VITAMIN B-12 (CYANOCOBALAMIN) 100 MCG TABLET    Take 50 mcg by mouth daily.  Modified Medications   No medications on file  Discontinued Medications   METOPROLOL SUCCINATE (TOPROL-XL) 50 MG 24 HR TABLET    Take one tablet by mouth once daily   METOPROLOL SUCCINATE (TOPROL-XL) 50 MG 24 HR TABLET    TAKE 1 TABLET BY MOUTH EVERY DAY     Physical Exam:  Filed Vitals:   10/10/14 1459  BP: 130/70  Pulse: 83  Temp: 99.3 F (37.4 C)  TempSrc: Oral  Resp: 18  Height: 4\' 11"  (1.499 m)  Weight: 146 lb 12.8 oz (66.588 kg)  SpO2: 97%     Physical Exam  Constitutional: She appears well-developed and well-nourished. No distress.  HENT:  Head: Normocephalic and atraumatic.  Mouth/Throat: Oropharynx is clear and moist. No oropharyngeal exudate.  Neck: Normal range of motion. Neck supple.  Cardiovascular: Normal rate, regular rhythm and normal heart sounds.   Pulmonary/Chest: Effort normal and breath sounds normal.  Abdominal: Soft. Bowel sounds are normal.  Musculoskeletal: She exhibits tenderness.  Tender right knee with slight swelling, walking with rollator walker, trouble bearing weight on right   Neurological: She is alert.  Skin: Skin is warm and dry.  Psychiatric: She has a normal mood and affect.    Labs reviewed: Basic Metabolic Panel:  Recent Labs  12/27/13 0837  NA 141  K 3.8  CL 102  CO2 25  GLUCOSE 98  BUN 23  CREATININE 1.33*  CALCIUM 9.3   Liver Function Tests:  Recent Labs  12/27/13 0837  AST 21  ALT 11  ALKPHOS 75  BILITOT 0.3  PROT 6.3   No results for input(s): LIPASE, AMYLASE in the last 8760 hours. No results for input(s): AMMONIA in the last 8760 hours. CBC: No results for input(s): WBC, NEUTROABS, HGB, HCT, MCV, PLT in the last 8760 hours. Lipid Panel:  Recent Labs  12/27/13 0837  CHOL 187  HDL 54  LDLCALC 109*  TRIG 119  CHOLHDL 3.5   TSH: No results for input(s): TSH in the last 8760 hours. A1C: Lab Results  Component Value Date   HGBA1C 6.5* 12/27/2013     Assessment/Plan 1. Primary osteoarthritis of right knee -worsening right knee pain, hx of joint injection however not taking tylenol or Voltaren gel routinely -will have pt take tylenol 500 mg TID and increase Voltaren gel usage   2. Hypothyroidism, unspecified hypothyroidism type -taking synthroid 88 mcg daily  - TSH  3. Essential hypertension -controlled on current regimen, will update lab work today.  - Basic metabolic panel - CBC with Differential  To follow up in 2-3 weeks for follow up  on knee pain, may need another joint injection with Dr Mariea Clonts at this time

## 2014-10-10 NOTE — Patient Instructions (Addendum)
To use tylenol 500 mg Three times daily for knee pain May use Voltaren gel up to 4 times daily for knee pain-- increase the use of this to help with pain  Follow up in 2-3 weeks with Dr Mariea Clonts   Will get blood work today

## 2014-10-11 LAB — CBC WITH DIFFERENTIAL/PLATELET
BASOS ABS: 0.1 10*3/uL (ref 0.0–0.2)
Basos: 2 %
EOS (ABSOLUTE): 0.2 10*3/uL (ref 0.0–0.4)
Eos: 3 %
HEMATOCRIT: 36.1 % (ref 34.0–46.6)
HEMOGLOBIN: 12.2 g/dL (ref 11.1–15.9)
Immature Grans (Abs): 0 10*3/uL (ref 0.0–0.1)
Immature Granulocytes: 0 %
LYMPHS ABS: 1.6 10*3/uL (ref 0.7–3.1)
LYMPHS: 29 %
MCH: 31.4 pg (ref 26.6–33.0)
MCHC: 33.8 g/dL (ref 31.5–35.7)
MCV: 93 fL (ref 79–97)
MONOCYTES: 13 %
Monocytes Absolute: 0.7 10*3/uL (ref 0.1–0.9)
NEUTROS PCT: 53 %
Neutrophils Absolute: 2.8 10*3/uL (ref 1.4–7.0)
Platelets: 238 10*3/uL (ref 150–379)
RBC: 3.89 x10E6/uL (ref 3.77–5.28)
RDW: 14.8 % (ref 12.3–15.4)
WBC: 5.3 10*3/uL (ref 3.4–10.8)

## 2014-10-11 LAB — BASIC METABOLIC PANEL
BUN/Creatinine Ratio: 21 (ref 11–26)
BUN: 32 mg/dL (ref 10–36)
CALCIUM: 9.3 mg/dL (ref 8.7–10.3)
CO2: 26 mmol/L (ref 18–29)
CREATININE: 1.49 mg/dL — AB (ref 0.57–1.00)
Chloride: 98 mmol/L (ref 97–108)
GFR, EST AFRICAN AMERICAN: 34 mL/min/{1.73_m2} — AB (ref >59)
GFR, EST NON AFRICAN AMERICAN: 30 mL/min/{1.73_m2} — AB (ref >59)
Glucose: 126 mg/dL — ABNORMAL HIGH (ref 65–99)
Potassium: 3.7 mmol/L (ref 3.5–5.2)
SODIUM: 140 mmol/L (ref 134–144)

## 2014-10-11 LAB — TSH: TSH: 3.33 u[IU]/mL (ref 0.450–4.500)

## 2014-11-04 ENCOUNTER — Encounter: Payer: Self-pay | Admitting: Internal Medicine

## 2014-11-04 ENCOUNTER — Ambulatory Visit (INDEPENDENT_AMBULATORY_CARE_PROVIDER_SITE_OTHER): Payer: Medicare Other | Admitting: Internal Medicine

## 2014-11-04 VITALS — BP 130/80 | HR 74 | Temp 97.4°F | Resp 20 | Ht 59.0 in | Wt 148.0 lb

## 2014-11-04 DIAGNOSIS — M1711 Unilateral primary osteoarthritis, right knee: Secondary | ICD-10-CM

## 2014-11-04 MED ORDER — DICLOFENAC SODIUM 1 % TD GEL
4.0000 g | Freq: Four times a day (QID) | TRANSDERMAL | Status: DC
Start: 2014-11-04 — End: 2015-01-31

## 2014-11-04 MED ORDER — LIDOCAINE HCL (PF) 1 % IJ SOLN
2.0000 mL | Freq: Once | INTRAMUSCULAR | Status: AC
  Administered 2014-11-04: 2 mL

## 2014-11-04 MED ORDER — METHYLPREDNISOLONE ACETATE 40 MG/ML IJ SUSP
40.0000 mg | Freq: Once | INTRAMUSCULAR | Status: AC
  Administered 2014-11-04: 40 mg via INTRA_ARTICULAR

## 2014-11-04 NOTE — Patient Instructions (Signed)
You can use the voltaren gel 4 times per day.  This should also help your knee.

## 2014-11-12 NOTE — Progress Notes (Signed)
Patient ID: Diana Anthony, female   DOB: Sep 17, 1916, 81 y.o.   MRN: [id number]   Location:  [facility]  Goals of Care: Advanced Directive information Does patient have an advance directive?: No, Would patient like information on creating an advanced directive?: Yes - Educational materials given (at 10/15 visit per other provider note)   Chief Complaint  Patient presents with  . Medical Management of Chronic Issues    F/u  knee pain /knee injection    HPI: Patient is a 81 y.o. white female pt seen in the office today for an acute visit due to unrelenting right knee pain that has not been getting better with voltaren gel or tylenol.  She received a prior steroid injection with relief of her pain.   She requests another injection today---she has an area of overt swelling and tenderness over her medical knee.  She's had difficulty ambulating with her rollator due to this pain.  Review of Systems:  Review of Systems  Constitutional: Positive for malaise/fatigue. Negative for fever and chills.  HENT: Positive for hearing loss. Negative for congestion.   Eyes:       Glasses  Respiratory: Negative for shortness of breath.   Cardiovascular: Negative for chest pain.  Gastrointestinal: Negative for abdominal pain.  Genitourinary: Negative for dysuria.  Musculoskeletal: Positive for joint pain. Negative for falls.       Unsteady gait, walks with rollator  Neurological: Negative for dizziness.  Psychiatric/Behavioral: Positive for memory loss.    Past Medical History  Diagnosis Date  . Unspecified vitamin D deficiency   . Osteoarthrosis, unspecified whether generalized or localized, unspecified site   . Trigger finger (acquired)   . First degree atrioventricular block   . Anxiety state, unspecified   . Unspecified constipation   . Senile osteoporosis   . Malignant neoplasm of breast (female), unspecified site   . Unspecified hypothyroidism   .  Dysthymic disorder   . Unspecified essential hypertension   . Hyperglycemia 01/30/09    Past Surgical History  Procedure Laterality Date  . Orif facial fracture  1962  . Mastectomy Left 1986    Dr.Weatherly   . Cataract extraction bilateral w/ anterior vitrectomy Bilateral 2002    Allergies  Allergen Reactions  . Macrobid [Nitrofurantoin Macrocrystal]    Medications: Patient's Medications  New Prescriptions   No medications on file  Previous Medications   AMLODIPINE-BENAZEPRIL (LOTREL) 5-10 MG PER CAPSULE    TAKE 1 CAPSULE DAILY TO    CONTROL BLOOD PRESSURE   CALCIUM CARB-CHOLECALCIFEROL (CALCIUM + D3) 600-200 MG-UNIT TABS    Take one tablet once daily for calcium   CHLORDIAZEPOXIDE (LIBRIUM) 10 MG CAPSULE    Take one capsule daily as needed for rest or nerves   CHOLECALCIFEROL (VITAMIN D) 1000 UNITS TABLET    Take 1,000 Units by mouth daily.   HYDROCHLOROTHIAZIDE (HYDRODIURIL) 25 MG TABLET    Take one tablet by mouth once daily for blood pressure   LECITHIN 1200 MG CAPS    Take by mouth. Once daily   LEVOTHYROXINE (SYNTHROID, LEVOTHROID) 88 MCG TABLET    Take one tablet by mouth once daily for thyroid   METOPROLOL SUCCINATE (TOPROL-XL) 50 MG 24 HR TABLET    TAKE 1 TABLET BY MOUTH EVERY DAY   TRIAMCINOLONE CREAM (KENALOG) 0.1 %    Apply twice daily as needed to irritated area.   VITAMIN B-12 (CYANOCOBALAMIN) 100 MCG TABLET    Take 50 mcg  by mouth daily.  Modified Medications   Modified Medication Previous Medication   DICLOFENAC SODIUM (VOLTAREN) 1 % GEL diclofenac sodium (VOLTAREN) 1 % GEL      Apply 4 g topically 4 (four) times daily. To right knee    Apply 4 g topically 4 (four) times daily. To right knee  Discontinued Medications   No medications on file    Physical Exam: Filed Vitals:   11/04/14 1042  BP: 130/80  Pulse: 74  Temp: 97.4 F (36.3 C)  TempSrc: Oral  Resp: 20  Height: 4\' 11"  (1.499 m)  Weight: 148 lb (67.132 kg)  SpO2: 95%   Physical Exam    Constitutional: She appears well-developed and well-nourished. No distress.  Cardiovascular: Normal rate and regular rhythm.   Pulmonary/Chest: Effort normal and breath sounds normal.  Musculoskeletal: She exhibits tenderness.  Right medial knee with effusion present  Neurological: She is alert.  Skin: Skin is warm and dry.  Psychiatric: She has a normal mood and affect.    Labs reviewed: Basic Metabolic Panel:  Recent Labs  12/27/13 0837 10/10/14 1523  NA 141 140  K 3.8 3.7  CL 102 98  CO2 25 26  GLUCOSE 98 126*  BUN 23 32  CREATININE 1.33* 1.49*  CALCIUM 9.3 9.3  TSH  --  3.330   Liver Function Tests:  Recent Labs  12/27/13 0837  AST 21  ALT 11  ALKPHOS 75  BILITOT 0.3  PROT 6.3   No results for input(s): LIPASE, AMYLASE in the last 8760 hours. No results for input(s): AMMONIA in the last 8760 hours. CBC:  Recent Labs  10/10/14 1523  WBC 5.3  NEUTROABS 2.8  HCT 36.1   Lipid Panel:  Recent Labs  12/27/13 0837  CHOL 187  HDL 54  LDLCALC 109*  TRIG 119  CHOLHDL 3.5   Lab Results  Component Value Date   HGBA1C 6.5* 12/27/2013    Assessment/Plan 1. Primary osteoarthritis of right knee -reminded her that her voltaren can be used 4x daily which may be more helpful than twice a day like she's been using it - diclofenac sodium (VOLTAREN) 1 % GEL; Apply 4 g topically 4 (four) times daily. To right knee  Dispense: 100 g; Refill: 3 -right knee was cleansed with betadine x 2, sprayed with bupivicaine anesthetic, then injection with 1:1 ratio of depomedrol and lidodaine 1% -bandaid applied -pt monitored for reaction and asked to flex/extend her knee several times before ambulating -noted immediate relief - methylPREDNISolone acetate (DEPO-MEDROL) injection 40 mg; Inject 1 mL (40 mg total) into the articular space once. - lidocaine (PF) (XYLOCAINE) 1 % injection 2 mL; 2 mLs by Other route once.  Labs/tests ordered:  none Next appt:  3 mos for med  mgt  Pradeep Beaubrun L. Dao Mearns, D.O. [facility] [address], [place] [address] Cell Phone (Mon-Fri 8am-5pm):  [phone] On Call:  [phone] & follow prompts after 5pm & weekends Office Phone:  [phone] Office Fax:  [phone]

## 2014-12-14 ENCOUNTER — Other Ambulatory Visit: Payer: Self-pay | Admitting: Internal Medicine

## 2014-12-21 ENCOUNTER — Other Ambulatory Visit: Payer: Self-pay | Admitting: Internal Medicine

## 2015-01-25 ENCOUNTER — Other Ambulatory Visit: Payer: Self-pay | Admitting: Internal Medicine

## 2015-01-31 ENCOUNTER — Ambulatory Visit (INDEPENDENT_AMBULATORY_CARE_PROVIDER_SITE_OTHER): Payer: Medicare Other | Admitting: Internal Medicine

## 2015-01-31 ENCOUNTER — Encounter: Payer: Self-pay | Admitting: Internal Medicine

## 2015-01-31 VITALS — BP 122/80 | HR 60 | Temp 98.3°F | Resp 20 | Ht 59.0 in | Wt 149.2 lb

## 2015-01-31 DIAGNOSIS — E039 Hypothyroidism, unspecified: Secondary | ICD-10-CM | POA: Diagnosis not present

## 2015-01-31 DIAGNOSIS — R739 Hyperglycemia, unspecified: Secondary | ICD-10-CM

## 2015-01-31 DIAGNOSIS — F411 Generalized anxiety disorder: Secondary | ICD-10-CM

## 2015-01-31 DIAGNOSIS — Z23 Encounter for immunization: Secondary | ICD-10-CM

## 2015-01-31 DIAGNOSIS — Z853 Personal history of malignant neoplasm of breast: Secondary | ICD-10-CM | POA: Diagnosis not present

## 2015-01-31 DIAGNOSIS — R413 Other amnesia: Secondary | ICD-10-CM | POA: Diagnosis not present

## 2015-01-31 DIAGNOSIS — M1711 Unilateral primary osteoarthritis, right knee: Secondary | ICD-10-CM

## 2015-01-31 DIAGNOSIS — N182 Chronic kidney disease, stage 2 (mild): Secondary | ICD-10-CM | POA: Insufficient documentation

## 2015-01-31 DIAGNOSIS — I10 Essential (primary) hypertension: Secondary | ICD-10-CM

## 2015-01-31 MED ORDER — METOPROLOL SUCCINATE ER 50 MG PO TB24: ORAL_TABLET | ORAL | Status: DC

## 2015-01-31 MED ORDER — AMLODIPINE BESY-BENAZEPRIL HCL 5-10 MG PO CAPS: ORAL_CAPSULE | ORAL | Status: DC

## 2015-01-31 MED ORDER — LEVOTHYROXINE SODIUM 88 MCG PO TABS: ORAL_TABLET | ORAL | Status: DC

## 2015-01-31 MED ORDER — HYDROCHLOROTHIAZIDE 25 MG PO TABS: ORAL_TABLET | ORAL | Status: DC

## 2015-01-31 MED ORDER — DICLOFENAC SODIUM 1 % TD GEL: 4.0000 g | Freq: Four times a day (QID) | TRANSDERMAL | Status: DC

## 2015-01-31 MED ORDER — CHLORDIAZEPOXIDE HCL 10 MG PO CAPS: ORAL_CAPSULE | ORAL | Status: DC

## 2015-01-31 NOTE — Progress Notes (Signed)
Failed clock test 

## 2015-01-31 NOTE — Progress Notes (Signed)
Patient ID: Diana Anthony, female   DOB: 08/11/1916, 81 y.o.   MRN: [id number]    HISTORY AND PHYSICAL  Location:    [facility]   Place of Service:   OFFICE  Extended Emergency Contact Information Primary Emergency Contact: Garen Grams Address: [place],  [place] Home Phone: [phone] Relation: None  Advanced Directive information Does patient have an advance directive?: No, Would patient like information on creating an advanced directive?: Yes - Educational materials given  Chief Complaint  Patient presents with  . Annual Exam  . Medical Management of Chronic Issues    HPI:  81 year old woman is feeling well and returns today for annual exam and management of chronic illnesses.  Essential hypertension - controlled  Hyperglycemia - latest glucose 126 mg percent  Hypothyroidism, unspecified hypothyroidism type - compensated  History of breast cancer in female - no evidence of relapse  Encounter for immunization - annual flu vaccine  Primary osteoarthritis of right knee - last injection seemed to help the knee a lot and she continues to walk. Knee is swollen but not painful right now.  CKD stage G2/A2, GFR 60 - 89 and albumin creatinine ratio 30 - 299 mg/g - modest elevations in BUN and creatinine  Memory impairment - she is wearing some slippage of her memory. Latest MMSE 24/30. Clock drawing was done very poorly and is consistent with memory impairment.    Past Medical History  Diagnosis Date  . Unspecified vitamin D deficiency   . Osteoarthrosis, unspecified whether generalized or localized, unspecified site   . Trigger finger (acquired)   . First degree atrioventricular block   . Anxiety state, unspecified   . Unspecified constipation   . Senile osteoporosis   . Malignant neoplasm of breast (female), unspecified site   . Unspecified hypothyroidism   . Dysthymic disorder   . Unspecified essential hypertension   . Hyperglycemia 01/30/09      Past Surgical History  Procedure Laterality Date  . Orif facial fracture  1962  . Mastectomy Left 1986    Dr.Weatherly   . Cataract extraction bilateral w/ anterior vitrectomy Bilateral 2002    Patient Care Team: [name], MD as PCP - General (Internal Medicine) [name], MD as Consulting Physician (Orthopedic Surgery) [name], MD as Attending Physician (General Surgery) [name], MD as Consulting Physician (Ophthalmology)  Social History   Social History  . Marital Status: Widowed    Spouse Name: N/A  . Number of Children: N/A  . Years of Education: N/A   Occupational History  . Not on file.   Social History Main Topics  . Smoking status: Never Smoker   . Smokeless tobacco: Never Used  . Alcohol Use: No  . Drug Use: No  . Sexual Activity: Not on file   Other Topics Concern  . Not on file   Social History Narrative   Mini-Mental State Exam 02/06/2010-28/30, passed clock drawing (Copied from [place])    reports that she has never smoked. She has never used smokeless tobacco. She reports that she does not drink alcohol or use illicit drugs.  Family History  Problem Relation Age of Onset  . Heart disease Sister   . Emphysema Brother   . Cerebrovascular Accident Mother   . Cerebrovascular Accident Father   . Hypertension Mother    Family Status  Relation Status Death Age  . Mother Deceased   . Father  Deceased   . Sister Deceased   . Brother Deceased   . Sister Deceased   . Sister Deceased   . Brother Deceased   . Brother Deceased   . Son Deceased   . Son Alive     Immunization History  Administered Date(s) Administered  . Influenza,inj,Quad PF,36+ Mos 02/18/2014  . Influenza-Unspecified 02/01/2009, 02/06/2010, 02/12/2011  . Pneumococcal Polysaccharide-23 03/25/2000  . Td 03/25/2000    Allergies  Allergen Reactions  . Macrobid [Nitrofurantoin Macrocrystal]     Medications: Patient's Medications  New Prescriptions    No medications on file  Previous Medications   AMLODIPINE-BENAZEPRIL (LOTREL) 5-10 MG PER CAPSULE    TAKE 1 CAPSULE DAILY TO    CONTROL BLOOD PRESSURE   CHLORDIAZEPOXIDE (LIBRIUM) 10 MG CAPSULE    Take one capsule daily as needed for rest or nerves   DICLOFENAC SODIUM (VOLTAREN) 1 % GEL    Apply 4 g topically 4 (four) times daily. To right knee   HYDROCHLOROTHIAZIDE (HYDRODIURIL) 25 MG TABLET    TAKE 1 TABLET BY MOUTH DAILY FOR BLOOD PRESSURE   LECITHIN 1200 MG CAPS    Take by mouth. Once daily   LEVOTHYROXINE (SYNTHROID, LEVOTHROID) 88 MCG TABLET    Take one tablet by mouth once daily for thyroid   METOPROLOL SUCCINATE (TOPROL-XL) 50 MG 24 HR TABLET    TAKE 1 TABLET BY MOUTH EVERY DAY   METOPROLOL SUCCINATE (TOPROL-XL) 50 MG 24 HR TABLET    TAKE 1 TABLET BY MOUTH EVERY DAY   SYNTHROID 88 MCG TABLET    TAKE 1 TABLET DAILY FOR    THYROID   TRIAMCINOLONE CREAM (KENALOG) 0.1 %    Apply twice daily as needed to irritated area.  Modified Medications   No medications on file  Discontinued Medications   CALCIUM CARB-CHOLECALCIFEROL (CALCIUM + D3) 600-200 MG-UNIT TABS    Take one tablet once daily for calcium   CHOLECALCIFEROL (VITAMIN D) 1000 UNITS TABLET    Take 1,000 Units by mouth daily.   VITAMIN B-12 (CYANOCOBALAMIN) 100 MCG TABLET    Take 50 mcg by mouth daily.    Review of Systems  Constitutional: Negative.   HENT: Negative.   Eyes: Negative.   Respiratory: Negative.   Cardiovascular: Positive for palpitations (PAC). Negative for chest pain and leg swelling.  Gastrointestinal: Positive for constipation.  Endocrine: Negative for cold intolerance, heat intolerance, polydipsia, polyphagia and polyuria.       Elevated glucose to 120 in 2010 and 126 on 10/10/14  Genitourinary:       Leakage.  Musculoskeletal:       Right knee pains.  Skin: Negative.   Allergic/Immunologic: Negative.   Neurological: Negative.   Hematological: Negative.   Psychiatric/Behavioral: Positive for sleep  disturbance and dysphoric mood. The patient is nervous/anxious.     Filed Vitals:   01/31/15 1456  BP: 122/80  Pulse: 60  Temp: 98.3 F (36.8 C)  TempSrc: Oral  Resp: 20  Height: '4\' 11"'  (1.499 m)  Weight: 149 lb 3.2 oz (67.677 kg)  SpO2: 97%   Body mass index is 30.12 kg/(m^2).  Physical Exam  Constitutional: She is oriented to person, place, and time. She appears well-nourished. No distress.  Frail.  HENT:  Head: Normocephalic and atraumatic.  Right Ear: External ear normal.  Nose: Nose normal.  Eyes:  Corrective lenses.  Neck: Neck supple. No JVD present. No tracheal deviation present. No thyromegaly present.  Cardiovascular: Normal rate, regular rhythm, normal heart sounds and  intact distal pulses.  Exam reveals no gallop and no friction rub.   No murmur heard. Varicose veins. PAC  Pulmonary/Chest: Effort normal and breath sounds normal. No respiratory distress. She has no wheezes. She has no rales. She exhibits no tenderness.  Left mastectomy  Abdominal: Bowel sounds are normal. She exhibits no distension and no mass. There is no tenderness.  Musculoskeletal: Normal range of motion. She exhibits no edema or tenderness.  Diffuse OA changes in hands. Right knee has small effusion and a swollen compared to the left. Nontender. Unstable gait. Using four-wheel walker with brakes and seat.  Lymphadenopathy:    She has no cervical adenopathy.  Neurological: She is alert and oriented to person, place, and time. No cranial nerve deficit. Coordination normal.  01/31/15 MMSE 24/30. Impaired clock drawing.  Skin: No rash noted. No erythema. No pallor.  Psychiatric: She has a normal mood and affect. Her behavior is normal. Thought content normal.    Labs reviewed: Lab Summary Latest Ref Rng 10/10/2014 12/27/2013 06/18/2013  Hemoglobin 11.1 - 15.9 g/dL 12.2 (None) (None)  Hematocrit 34.0 - 46.6 % 36.1 (None) (None)  White count 3.4 - 10.8 x10E3/uL 5.3 (None) (None)  Platelet  count 150 - 379 x10E3/uL 238 (None) (None)  Sodium 134 - 144 mmol/L 140 141 143  Potassium 3.5 - 5.2 mmol/L 3.7 3.8 3.8  Calcium 8.7 - 10.3 mg/dL 9.3 9.3 9.8  Phosphorus - (None) (None) (None)  Creatinine 0.57 - 1.00 mg/dL 1.49(H) 1.33(H) 1.54(H)  AST 0 - 40 IU/L (None) 21 22  Alk Phos 39 - 117 IU/L (None) 75 84  Bilirubin 0.0 - 1.2 mg/dL (None) 0.3 0.4  Glucose 65 - 99 mg/dL 126(H) 98 88  Cholesterol - (None) (None) (None)  HDL cholesterol >39 mg/dL (None) 54 61  Triglycerides 0 - 149 mg/dL (None) 119 129  LDL Direct - (None) (None) (None)  LDL Calc 0 - 99 mg/dL (None) 109(H) 99  Total protein - (None) (None) (None)  Albumin 3.2 - 4.6 g/dL (None) 3.9 4.4   Lab Results  Component Value Date   BUN 32 10/10/2014   Lab Results  Component Value Date   HGBA1C 6.5* 12/27/2013   Lab Results  Component Value Date   TSH 3.330 10/10/2014   01/31/2015 EKG rate 86. Normal sinus rhythm. PAC. LAD. Possible left ventricular hypertrophy.   Assessment/Plan  1. Essential hypertension Controlled. Continue metoprolol - EKG 12-Lead - Comprehensive metabolic panel; Future  2. Hyperglycemia Mild and diet controlled - Comprehensive metabolic panel; Future - Hemoglobin A1c; Future  3. Hypothyroidism, unspecified hypothyroidism type Compensated - TSH; Future  4. History of breast cancer in female No relapse  5. Encounter for immunization Flu vaccine given  6. Primary osteoarthritis of right knee Comfortable at present. Small right knee effusion. Using walker.  7. CKD stage G2/A2, GFR 60 - 89 and albumin creatinine ratio 30 - 299 mg/g -CMP, future  8. Memory impairment Follow-up MMSE in 6 months-

## 2015-02-06 ENCOUNTER — Ambulatory Visit: Payer: Medicare Other | Admitting: Internal Medicine

## 2015-03-11 ENCOUNTER — Other Ambulatory Visit: Payer: Self-pay | Admitting: Internal Medicine

## 2015-03-30 DIAGNOSIS — H16143 Punctate keratitis, bilateral: Secondary | ICD-10-CM | POA: Diagnosis not present

## 2015-03-30 DIAGNOSIS — Z961 Presence of intraocular lens: Secondary | ICD-10-CM | POA: Diagnosis not present

## 2015-03-30 DIAGNOSIS — H353131 Nonexudative age-related macular degeneration, bilateral, early dry stage: Secondary | ICD-10-CM | POA: Diagnosis not present

## 2015-03-30 DIAGNOSIS — H1851 Endothelial corneal dystrophy: Secondary | ICD-10-CM | POA: Diagnosis not present

## 2015-07-31 ENCOUNTER — Other Ambulatory Visit: Payer: Medicare Other

## 2015-08-02 ENCOUNTER — Ambulatory Visit: Payer: Medicare Other | Admitting: Internal Medicine

## 2015-08-07 ENCOUNTER — Other Ambulatory Visit: Payer: Medicare Other

## 2015-08-07 DIAGNOSIS — E039 Hypothyroidism, unspecified: Secondary | ICD-10-CM

## 2015-08-07 DIAGNOSIS — I10 Essential (primary) hypertension: Secondary | ICD-10-CM | POA: Diagnosis not present

## 2015-08-07 DIAGNOSIS — R739 Hyperglycemia, unspecified: Secondary | ICD-10-CM

## 2015-08-08 LAB — TSH: TSH: 3.18 u[IU]/mL (ref 0.450–4.500)

## 2015-08-08 LAB — COMPREHENSIVE METABOLIC PANEL
ALK PHOS: 84 IU/L (ref 39–117)
ALT: 5 IU/L (ref 0–32)
AST: 20 IU/L (ref 0–40)
Albumin/Globulin Ratio: 1.3 (ref 1.2–2.2)
Albumin: 4.1 g/dL (ref 3.2–4.6)
BUN/Creatinine Ratio: 15 (ref 11–26)
BUN: 23 mg/dL (ref 10–36)
Bilirubin Total: 0.6 mg/dL (ref 0.0–1.2)
CO2: 27 mmol/L (ref 18–29)
CREATININE: 1.58 mg/dL — AB (ref 0.57–1.00)
Calcium: 9.5 mg/dL (ref 8.7–10.3)
Chloride: 95 mmol/L — ABNORMAL LOW (ref 96–106)
GFR calc Af Amer: 32 mL/min/{1.73_m2} — ABNORMAL LOW (ref >59)
GFR calc non Af Amer: 27 mL/min/{1.73_m2} — ABNORMAL LOW (ref >59)
Globulin, Total: 3.1 g/dL (ref 1.5–4.5)
Glucose: 109 mg/dL — ABNORMAL HIGH (ref 65–99)
Potassium: 3.6 mmol/L (ref 3.5–5.2)
SODIUM: 138 mmol/L (ref 134–144)
Total Protein: 7.2 g/dL (ref 6.0–8.5)

## 2015-08-08 LAB — HEMOGLOBIN A1C
ESTIMATED AVERAGE GLUCOSE: 123 mg/dL
HEMOGLOBIN A1C: 5.9 % — AB (ref 4.8–5.6)

## 2015-08-09 ENCOUNTER — Ambulatory Visit (INDEPENDENT_AMBULATORY_CARE_PROVIDER_SITE_OTHER): Payer: Medicare Other | Admitting: Internal Medicine

## 2015-08-09 ENCOUNTER — Encounter: Payer: Self-pay | Admitting: Internal Medicine

## 2015-08-09 VITALS — BP 130/82 | HR 75 | Temp 98.7°F | Ht 59.0 in | Wt 144.0 lb

## 2015-08-09 DIAGNOSIS — R413 Other amnesia: Secondary | ICD-10-CM

## 2015-08-09 DIAGNOSIS — Z23 Encounter for immunization: Secondary | ICD-10-CM

## 2015-08-09 DIAGNOSIS — E039 Hypothyroidism, unspecified: Secondary | ICD-10-CM

## 2015-08-09 DIAGNOSIS — N182 Chronic kidney disease, stage 2 (mild): Secondary | ICD-10-CM

## 2015-08-09 DIAGNOSIS — R739 Hyperglycemia, unspecified: Secondary | ICD-10-CM

## 2015-08-09 DIAGNOSIS — I10 Essential (primary) hypertension: Secondary | ICD-10-CM | POA: Diagnosis not present

## 2015-08-09 NOTE — Patient Instructions (Signed)
Bring copy of Advance Directives- Health Care Power of Attorney and/or Living Will to next appointment.   

## 2015-08-09 NOTE — Addendum Note (Signed)
Addended byMarisa Cyphers C on: 08/09/2015 01:41 PM   Modules accepted: Orders

## 2015-08-09 NOTE — Progress Notes (Signed)
Patient ID: Diana Anthony, female   DOB: 01-02-1917, 82 y.o.   MRN: [id number]   Location:  [facility]  Provider: Jeanmarie Hubert, MD  Code Status: full Goals of Care:  Advanced Directives 08/09/2015  Does patient have an advance directive? Yes  Type of Paramedic of [place];Living will  Copy of advanced directive(s) in chart? No - copy requested  Would patient like information on creating an advanced directive? -     Chief Complaint  Patient presents with  . Medical Management of Chronic Issues    6 month follow-up, discuss labs (copy printed)   . Immunizations    Discuss need for Pneu 13, patient not sure if she needs this or not     HPI: Patient is a 82 y.o. female seen today for medical management of chronic diseases.    Feeling well.  CKD stage G2/A2, GFR 60 - 89 and albumin creatinine ratio 30 - 299 mg/g - stable  Hyperglycemia - stable  Hypothyroidism, unspecified hypothyroidism type - compensated  Memory impairment - she is aware it is slipping  Essential hypertension - controlled     Past Medical History  Diagnosis Date  . Unspecified vitamin D deficiency   . Osteoarthrosis, unspecified whether generalized or localized, unspecified site   . Trigger finger (acquired)   . First degree atrioventricular block   . Anxiety state, unspecified   . Unspecified constipation   . Senile osteoporosis   . Malignant neoplasm of breast (female), unspecified site   . Unspecified hypothyroidism   . Dysthymic disorder   . Unspecified essential hypertension   . Hyperglycemia 01/30/09    Past Surgical History  Procedure Laterality Date  . Orif facial fracture  1962  . Mastectomy Left 1986    Dr.Weatherly   . Cataract extraction bilateral w/ anterior vitrectomy Bilateral 2002    Allergies  Allergen Reactions  . Macrobid [Nitrofurantoin Macrocrystal]       Medication List       This list is accurate as of: 08/09/15  1:02 PM.  Always use  your most recent med list.               amLODipine-benazepril 5-10 MG capsule  Commonly known as:  LOTREL  TAKE 1 CAPSULE DAILY TO  CONTROL BLOOD PRESSURE     chlordiazePOXIDE 10 MG capsule  Commonly known as:  LIBRIUM  Take one capsule daily as needed for rest or nerves     diclofenac sodium 1 % Gel  Commonly known as:  VOLTAREN  Apply 4 g topically 4 (four) times daily. To right knee     hydrochlorothiazide 25 MG tablet  Commonly known as:  HYDRODIURIL  Take one tablet daily to control blood pressure     Lecithin 1200 MG Caps  Take by mouth. Once daily     levothyroxine 88 MCG tablet  Commonly known as:  SYNTHROID, LEVOTHROID  Take one tablet daily for thyroid supplement     metoprolol succinate 50 MG 24 hr tablet  Commonly known as:  TOPROL-XL  Take one daily to control blood pressure and heart rhythm. Take with or immediately following a meal.        Review of Systems:  Review of Systems  Constitutional: Negative.   HENT: Negative.   Eyes: Negative.   Respiratory: Negative.   Cardiovascular: Positive for palpitations (PAC). Negative for chest pain and leg swelling.  Gastrointestinal: Positive for constipation.  Endocrine: Negative for cold intolerance, heat intolerance,  polydipsia, polyphagia and polyuria.       Elevated glucose to 120 in 2010 and 126 on 10/10/14  Genitourinary:       Leakage.  Musculoskeletal:       Right knee pains.  Skin: Negative.   Allergic/Immunologic: Negative.   Neurological: Negative.   Hematological: Negative.   Psychiatric/Behavioral: Positive for sleep disturbance and dysphoric mood. The patient is nervous/anxious.     Health Maintenance  Topic Date Due  . PNA vac Low Risk Adult (2 of 2 - PCV13) 03/25/2001  . ZOSTAVAX  05/20/2018 (Originally 01/05/1979)  . TETANUS/TDAP  05/20/2018 (Originally 03/25/2010)  . INFLUENZA VACCINE  12/19/2015  . DEXA SCAN  Completed    Physical Exam: Filed Vitals:   08/09/15 1204  BP:  130/82  Pulse: 75  Temp: 98.7 F (37.1 C)  TempSrc: Oral  Height: 4\' 11"  (1.499 m)  Weight: 144 lb (65.318 kg)  SpO2: 97%   Body mass index is 29.07 kg/(m^2). Physical Exam  Constitutional: She is oriented to person, place, and time. She appears well-nourished. No distress.  Frail.  HENT:  Head: Normocephalic and atraumatic.  Right Ear: External ear normal.  Nose: Nose normal.  Eyes:  Corrective lenses.  Neck: Neck supple. No JVD present. No tracheal deviation present. No thyromegaly present.  Cardiovascular: Normal rate, regular rhythm, normal heart sounds and intact distal pulses.  Exam reveals no gallop and no friction rub.   No murmur heard. Varicose veins. PAC  Pulmonary/Chest: Effort normal and breath sounds normal. No respiratory distress. She has no wheezes. She has no rales. She exhibits no tenderness.  Left mastectomy  Abdominal: Bowel sounds are normal. She exhibits no distension and no mass. There is no tenderness.  Musculoskeletal: Normal range of motion. She exhibits no edema or tenderness.  Diffuse OA changes in hands. Right knee has small effusion and a swollen compared to the left. Nontender. Unstable gait. Using four-wheel walker with brakes and seat.  Lymphadenopathy:    She has no cervical adenopathy.  Neurological: She is alert and oriented to person, place, and time. No cranial nerve deficit. Coordination normal.  01/31/15 MMSE 24/30. Impaired clock drawing.  Skin: No rash noted. No erythema. No pallor.  Psychiatric: She has a normal mood and affect. Her behavior is normal. Thought content normal.    Labs reviewed: Basic Metabolic Panel:  Recent Labs  10/10/14 1523 08/07/15 0916  NA 140 138  K 3.7 3.6  CL 98 95*  CO2 26 27  GLUCOSE 126* 109*  BUN 32 23  CREATININE 1.49* 1.58*  CALCIUM 9.3 9.5  TSH 3.330 3.180   Liver Function Tests:  Recent Labs  08/07/15 0916  AST 20  ALT 5  ALKPHOS 84  BILITOT 0.6  PROT 7.2  ALBUMIN 4.1   No  results for input(s): LIPASE, AMYLASE in the last 8760 hours. No results for input(s): AMMONIA in the last 8760 hours. CBC:  Recent Labs  10/10/14 1523  WBC 5.3  NEUTROABS 2.8  HCT 36.1  MCV 93  PLT 238   Lipid Panel: No results for input(s): CHOL, HDL, LDLCALC, TRIG, CHOLHDL, LDLDIRECT in the last 8760 hours. Lab Results  Component Value Date   HGBA1C 5.9* 08/07/2015    Assessment/Plan  1. CKD stage G2/A2, GFR 60 - 89 and albumin creatinine ratio 30 - 299 mg/g - Comprehensive metabolic panel; Future  2. Hyperglycemia - Comprehensive metabolic panel; Future - Hemoglobin A1c; Future - Microalbumin, urine; Future  3. Hypothyroidism, unspecified hypothyroidism  type - Lipid panel; Future - TSH; Future  4. Memory impairment MMSE next visit  5. Essential hypertension controlled

## 2015-11-03 ENCOUNTER — Telehealth: Payer: Self-pay | Admitting: Internal Medicine

## 2015-11-03 DIAGNOSIS — R413 Other amnesia: Secondary | ICD-10-CM

## 2015-11-03 DIAGNOSIS — H543 Unqualified visual loss, both eyes: Secondary | ICD-10-CM

## 2015-11-03 NOTE — Telephone Encounter (Signed)
Please see the below message I received from Margot Ables at [facility]  Mrs Diana Anthony, [birth year], lives at [place], and is having problems with her vision and being able to function there. The family and [name] have agreed that she needs PT/OT for safety and Low Vision. Can we get an order on her?   Please Advise Thank you

## 2015-11-03 NOTE — Telephone Encounter (Signed)
Go ahead with referrals as requested

## 2015-11-03 NOTE — Telephone Encounter (Signed)
Order placed and Dorothy aware.

## 2015-11-03 NOTE — Addendum Note (Signed)
Addended by: Logan Bores on: 11/03/2015 02:34 PM   Modules accepted: Orders

## 2015-11-03 NOTE — Telephone Encounter (Signed)
Order cancelled for PT and new order placed for home health

## 2015-11-06 ENCOUNTER — Telehealth: Payer: Self-pay | Admitting: *Deleted

## 2015-11-06 DIAGNOSIS — Z9012 Acquired absence of left breast and nipple: Secondary | ICD-10-CM | POA: Diagnosis not present

## 2015-11-06 DIAGNOSIS — N182 Chronic kidney disease, stage 2 (mild): Secondary | ICD-10-CM | POA: Diagnosis not present

## 2015-11-06 DIAGNOSIS — R739 Hyperglycemia, unspecified: Secondary | ICD-10-CM | POA: Diagnosis not present

## 2015-11-06 DIAGNOSIS — F419 Anxiety disorder, unspecified: Secondary | ICD-10-CM | POA: Diagnosis not present

## 2015-11-06 DIAGNOSIS — M6281 Muscle weakness (generalized): Secondary | ICD-10-CM | POA: Diagnosis not present

## 2015-11-06 DIAGNOSIS — R413 Other amnesia: Secondary | ICD-10-CM | POA: Diagnosis not present

## 2015-11-06 DIAGNOSIS — Z9181 History of falling: Secondary | ICD-10-CM | POA: Diagnosis not present

## 2015-11-06 DIAGNOSIS — H542 Low vision, both eyes: Secondary | ICD-10-CM | POA: Diagnosis not present

## 2015-11-06 DIAGNOSIS — I129 Hypertensive chronic kidney disease with stage 1 through stage 4 chronic kidney disease, or unspecified chronic kidney disease: Secondary | ICD-10-CM | POA: Diagnosis not present

## 2015-11-06 DIAGNOSIS — M1711 Unilateral primary osteoarthritis, right knee: Secondary | ICD-10-CM | POA: Diagnosis not present

## 2015-11-06 DIAGNOSIS — Z853 Personal history of malignant neoplasm of breast: Secondary | ICD-10-CM | POA: Diagnosis not present

## 2015-11-06 NOTE — Telephone Encounter (Signed)
Diana Anthony with [facility] called and stated that she needed verbal orders for start of care date, which was today and to continue PT/OT.  Verbal order given.

## 2015-11-08 DIAGNOSIS — H542 Low vision, both eyes: Secondary | ICD-10-CM | POA: Diagnosis not present

## 2015-11-08 DIAGNOSIS — F419 Anxiety disorder, unspecified: Secondary | ICD-10-CM | POA: Diagnosis not present

## 2015-11-08 DIAGNOSIS — I129 Hypertensive chronic kidney disease with stage 1 through stage 4 chronic kidney disease, or unspecified chronic kidney disease: Secondary | ICD-10-CM | POA: Diagnosis not present

## 2015-11-08 DIAGNOSIS — N182 Chronic kidney disease, stage 2 (mild): Secondary | ICD-10-CM | POA: Diagnosis not present

## 2015-11-08 DIAGNOSIS — R413 Other amnesia: Secondary | ICD-10-CM | POA: Diagnosis not present

## 2015-11-08 DIAGNOSIS — M6281 Muscle weakness (generalized): Secondary | ICD-10-CM | POA: Diagnosis not present

## 2015-11-10 DIAGNOSIS — I129 Hypertensive chronic kidney disease with stage 1 through stage 4 chronic kidney disease, or unspecified chronic kidney disease: Secondary | ICD-10-CM | POA: Diagnosis not present

## 2015-11-10 DIAGNOSIS — R413 Other amnesia: Secondary | ICD-10-CM | POA: Diagnosis not present

## 2015-11-10 DIAGNOSIS — H542 Low vision, both eyes: Secondary | ICD-10-CM | POA: Diagnosis not present

## 2015-11-10 DIAGNOSIS — M6281 Muscle weakness (generalized): Secondary | ICD-10-CM | POA: Diagnosis not present

## 2015-11-10 DIAGNOSIS — N182 Chronic kidney disease, stage 2 (mild): Secondary | ICD-10-CM | POA: Diagnosis not present

## 2015-11-10 DIAGNOSIS — F419 Anxiety disorder, unspecified: Secondary | ICD-10-CM | POA: Diagnosis not present

## 2015-11-13 DIAGNOSIS — N182 Chronic kidney disease, stage 2 (mild): Secondary | ICD-10-CM | POA: Diagnosis not present

## 2015-11-13 DIAGNOSIS — M6281 Muscle weakness (generalized): Secondary | ICD-10-CM | POA: Diagnosis not present

## 2015-11-13 DIAGNOSIS — R413 Other amnesia: Secondary | ICD-10-CM | POA: Diagnosis not present

## 2015-11-13 DIAGNOSIS — F419 Anxiety disorder, unspecified: Secondary | ICD-10-CM | POA: Diagnosis not present

## 2015-11-13 DIAGNOSIS — H542 Low vision, both eyes: Secondary | ICD-10-CM | POA: Diagnosis not present

## 2015-11-13 DIAGNOSIS — I129 Hypertensive chronic kidney disease with stage 1 through stage 4 chronic kidney disease, or unspecified chronic kidney disease: Secondary | ICD-10-CM | POA: Diagnosis not present

## 2015-11-15 DIAGNOSIS — N182 Chronic kidney disease, stage 2 (mild): Secondary | ICD-10-CM | POA: Diagnosis not present

## 2015-11-15 DIAGNOSIS — I129 Hypertensive chronic kidney disease with stage 1 through stage 4 chronic kidney disease, or unspecified chronic kidney disease: Secondary | ICD-10-CM | POA: Diagnosis not present

## 2015-11-15 DIAGNOSIS — H542 Low vision, both eyes: Secondary | ICD-10-CM | POA: Diagnosis not present

## 2015-11-15 DIAGNOSIS — F419 Anxiety disorder, unspecified: Secondary | ICD-10-CM | POA: Diagnosis not present

## 2015-11-15 DIAGNOSIS — M6281 Muscle weakness (generalized): Secondary | ICD-10-CM | POA: Diagnosis not present

## 2015-11-15 DIAGNOSIS — R413 Other amnesia: Secondary | ICD-10-CM | POA: Diagnosis not present

## 2015-11-17 DIAGNOSIS — H542 Low vision, both eyes: Secondary | ICD-10-CM | POA: Diagnosis not present

## 2015-11-17 DIAGNOSIS — N182 Chronic kidney disease, stage 2 (mild): Secondary | ICD-10-CM | POA: Diagnosis not present

## 2015-11-17 DIAGNOSIS — F419 Anxiety disorder, unspecified: Secondary | ICD-10-CM | POA: Diagnosis not present

## 2015-11-17 DIAGNOSIS — M6281 Muscle weakness (generalized): Secondary | ICD-10-CM | POA: Diagnosis not present

## 2015-11-17 DIAGNOSIS — I129 Hypertensive chronic kidney disease with stage 1 through stage 4 chronic kidney disease, or unspecified chronic kidney disease: Secondary | ICD-10-CM | POA: Diagnosis not present

## 2015-11-17 DIAGNOSIS — R413 Other amnesia: Secondary | ICD-10-CM | POA: Diagnosis not present

## 2015-11-20 DIAGNOSIS — R413 Other amnesia: Secondary | ICD-10-CM | POA: Diagnosis not present

## 2015-11-20 DIAGNOSIS — N182 Chronic kidney disease, stage 2 (mild): Secondary | ICD-10-CM | POA: Diagnosis not present

## 2015-11-20 DIAGNOSIS — H542 Low vision, both eyes: Secondary | ICD-10-CM | POA: Diagnosis not present

## 2015-11-20 DIAGNOSIS — M6281 Muscle weakness (generalized): Secondary | ICD-10-CM | POA: Diagnosis not present

## 2015-11-20 DIAGNOSIS — I129 Hypertensive chronic kidney disease with stage 1 through stage 4 chronic kidney disease, or unspecified chronic kidney disease: Secondary | ICD-10-CM | POA: Diagnosis not present

## 2015-11-20 DIAGNOSIS — F419 Anxiety disorder, unspecified: Secondary | ICD-10-CM | POA: Diagnosis not present

## 2015-11-22 DIAGNOSIS — N182 Chronic kidney disease, stage 2 (mild): Secondary | ICD-10-CM | POA: Diagnosis not present

## 2015-11-22 DIAGNOSIS — F419 Anxiety disorder, unspecified: Secondary | ICD-10-CM | POA: Diagnosis not present

## 2015-11-22 DIAGNOSIS — R413 Other amnesia: Secondary | ICD-10-CM | POA: Diagnosis not present

## 2015-11-22 DIAGNOSIS — H542 Low vision, both eyes: Secondary | ICD-10-CM | POA: Diagnosis not present

## 2015-11-22 DIAGNOSIS — I129 Hypertensive chronic kidney disease with stage 1 through stage 4 chronic kidney disease, or unspecified chronic kidney disease: Secondary | ICD-10-CM | POA: Diagnosis not present

## 2015-11-22 DIAGNOSIS — M6281 Muscle weakness (generalized): Secondary | ICD-10-CM | POA: Diagnosis not present

## 2015-11-24 DIAGNOSIS — N182 Chronic kidney disease, stage 2 (mild): Secondary | ICD-10-CM | POA: Diagnosis not present

## 2015-11-24 DIAGNOSIS — H542 Low vision, both eyes: Secondary | ICD-10-CM | POA: Diagnosis not present

## 2015-11-24 DIAGNOSIS — R413 Other amnesia: Secondary | ICD-10-CM | POA: Diagnosis not present

## 2015-11-24 DIAGNOSIS — F419 Anxiety disorder, unspecified: Secondary | ICD-10-CM | POA: Diagnosis not present

## 2015-11-24 DIAGNOSIS — M6281 Muscle weakness (generalized): Secondary | ICD-10-CM | POA: Diagnosis not present

## 2015-11-24 DIAGNOSIS — I129 Hypertensive chronic kidney disease with stage 1 through stage 4 chronic kidney disease, or unspecified chronic kidney disease: Secondary | ICD-10-CM | POA: Diagnosis not present

## 2015-11-27 DIAGNOSIS — I129 Hypertensive chronic kidney disease with stage 1 through stage 4 chronic kidney disease, or unspecified chronic kidney disease: Secondary | ICD-10-CM | POA: Diagnosis not present

## 2015-11-27 DIAGNOSIS — F419 Anxiety disorder, unspecified: Secondary | ICD-10-CM | POA: Diagnosis not present

## 2015-11-27 DIAGNOSIS — H542 Low vision, both eyes: Secondary | ICD-10-CM | POA: Diagnosis not present

## 2015-11-27 DIAGNOSIS — R413 Other amnesia: Secondary | ICD-10-CM | POA: Diagnosis not present

## 2015-11-27 DIAGNOSIS — M6281 Muscle weakness (generalized): Secondary | ICD-10-CM | POA: Diagnosis not present

## 2015-11-27 DIAGNOSIS — N182 Chronic kidney disease, stage 2 (mild): Secondary | ICD-10-CM | POA: Diagnosis not present

## 2015-11-29 DIAGNOSIS — M6281 Muscle weakness (generalized): Secondary | ICD-10-CM | POA: Diagnosis not present

## 2015-11-29 DIAGNOSIS — H542 Low vision, both eyes: Secondary | ICD-10-CM | POA: Diagnosis not present

## 2015-11-29 DIAGNOSIS — F419 Anxiety disorder, unspecified: Secondary | ICD-10-CM | POA: Diagnosis not present

## 2015-11-29 DIAGNOSIS — N182 Chronic kidney disease, stage 2 (mild): Secondary | ICD-10-CM | POA: Diagnosis not present

## 2015-11-29 DIAGNOSIS — I129 Hypertensive chronic kidney disease with stage 1 through stage 4 chronic kidney disease, or unspecified chronic kidney disease: Secondary | ICD-10-CM | POA: Diagnosis not present

## 2015-11-29 DIAGNOSIS — R413 Other amnesia: Secondary | ICD-10-CM | POA: Diagnosis not present

## 2015-12-01 DIAGNOSIS — H542 Low vision, both eyes: Secondary | ICD-10-CM | POA: Diagnosis not present

## 2015-12-01 DIAGNOSIS — R413 Other amnesia: Secondary | ICD-10-CM | POA: Diagnosis not present

## 2015-12-01 DIAGNOSIS — F419 Anxiety disorder, unspecified: Secondary | ICD-10-CM | POA: Diagnosis not present

## 2015-12-01 DIAGNOSIS — M6281 Muscle weakness (generalized): Secondary | ICD-10-CM | POA: Diagnosis not present

## 2015-12-01 DIAGNOSIS — N182 Chronic kidney disease, stage 2 (mild): Secondary | ICD-10-CM | POA: Diagnosis not present

## 2015-12-01 DIAGNOSIS — I129 Hypertensive chronic kidney disease with stage 1 through stage 4 chronic kidney disease, or unspecified chronic kidney disease: Secondary | ICD-10-CM | POA: Diagnosis not present

## 2015-12-11 DIAGNOSIS — I129 Hypertensive chronic kidney disease with stage 1 through stage 4 chronic kidney disease, or unspecified chronic kidney disease: Secondary | ICD-10-CM | POA: Diagnosis not present

## 2015-12-11 DIAGNOSIS — H542 Low vision, both eyes: Secondary | ICD-10-CM | POA: Diagnosis not present

## 2015-12-11 DIAGNOSIS — M6281 Muscle weakness (generalized): Secondary | ICD-10-CM | POA: Diagnosis not present

## 2015-12-11 DIAGNOSIS — R413 Other amnesia: Secondary | ICD-10-CM | POA: Diagnosis not present

## 2015-12-11 DIAGNOSIS — N182 Chronic kidney disease, stage 2 (mild): Secondary | ICD-10-CM | POA: Diagnosis not present

## 2015-12-11 DIAGNOSIS — F419 Anxiety disorder, unspecified: Secondary | ICD-10-CM | POA: Diagnosis not present

## 2016-01-05 NOTE — Addendum Note (Signed)
Addended by: Logan Bores on: 01/17/2016 03:44 PM   Modules accepted: Orders

## 2016-01-24 ENCOUNTER — Other Ambulatory Visit: Payer: Self-pay | Admitting: Internal Medicine

## 2016-01-24 DIAGNOSIS — I10 Essential (primary) hypertension: Secondary | ICD-10-CM

## 2016-02-16 ENCOUNTER — Other Ambulatory Visit: Payer: Medicare Other

## 2016-02-19 ENCOUNTER — Other Ambulatory Visit: Payer: Medicare Other

## 2016-02-19 DIAGNOSIS — E039 Hypothyroidism, unspecified: Secondary | ICD-10-CM

## 2016-02-19 DIAGNOSIS — N182 Chronic kidney disease, stage 2 (mild): Secondary | ICD-10-CM | POA: Diagnosis not present

## 2016-02-19 DIAGNOSIS — R739 Hyperglycemia, unspecified: Secondary | ICD-10-CM

## 2016-02-19 LAB — TSH: TSH: 7.44 mIU/L — ABNORMAL HIGH

## 2016-02-19 LAB — COMPREHENSIVE METABOLIC PANEL
ALBUMIN: 3.7 g/dL (ref 3.6–5.1)
ALK PHOS: 72 U/L (ref 33–130)
ALT: 7 U/L (ref 6–29)
AST: 20 U/L (ref 10–35)
BILIRUBIN TOTAL: 0.6 mg/dL (ref 0.2–1.2)
BUN: 21 mg/dL (ref 7–25)
CALCIUM: 9.1 mg/dL (ref 8.6–10.4)
CO2: 26 mmol/L (ref 20–31)
Chloride: 103 mmol/L (ref 98–110)
Creat: 1.68 mg/dL — ABNORMAL HIGH (ref 0.60–0.88)
Glucose, Bld: 89 mg/dL (ref 65–99)
POTASSIUM: 4 mmol/L (ref 3.5–5.3)
Sodium: 139 mmol/L (ref 135–146)
TOTAL PROTEIN: 6.4 g/dL (ref 6.1–8.1)

## 2016-02-19 LAB — LIPID PANEL
Cholesterol: 172 mg/dL (ref 125–200)
HDL: 53 mg/dL (ref >=46)
LDL Cholesterol: 93 mg/dL (ref <130)
Total CHOL/HDL Ratio: 3.2 Ratio (ref <=5.0)
Triglycerides: 132 mg/dL (ref <150)
VLDL: 26 mg/dL (ref <30)

## 2016-02-20 ENCOUNTER — Encounter: Payer: Self-pay | Admitting: Internal Medicine

## 2016-02-20 ENCOUNTER — Ambulatory Visit (INDEPENDENT_AMBULATORY_CARE_PROVIDER_SITE_OTHER): Payer: Medicare Other | Admitting: Internal Medicine

## 2016-02-20 ENCOUNTER — Other Ambulatory Visit: Payer: Self-pay | Admitting: Internal Medicine

## 2016-02-20 VITALS — BP 170/90 | HR 68 | Temp 98.3°F | Ht 59.0 in | Wt 145.0 lb

## 2016-02-20 DIAGNOSIS — M1711 Unilateral primary osteoarthritis, right knee: Secondary | ICD-10-CM

## 2016-02-20 DIAGNOSIS — R739 Hyperglycemia, unspecified: Secondary | ICD-10-CM

## 2016-02-20 DIAGNOSIS — I10 Essential (primary) hypertension: Secondary | ICD-10-CM

## 2016-02-20 DIAGNOSIS — E039 Hypothyroidism, unspecified: Secondary | ICD-10-CM

## 2016-02-20 DIAGNOSIS — Z23 Encounter for immunization: Secondary | ICD-10-CM

## 2016-02-20 DIAGNOSIS — R413 Other amnesia: Secondary | ICD-10-CM

## 2016-02-20 LAB — MICROALBUMIN, URINE: Microalb, Ur: 11.8 mg/dL

## 2016-02-20 LAB — HEMOGLOBIN A1C
Hgb A1c MFr Bld: 5.9 % — ABNORMAL HIGH (ref <5.7)
Mean Plasma Glucose: 123 mg/dL

## 2016-02-20 NOTE — Progress Notes (Signed)
Facility  [facility]    Place of Service:   OFFICE    Allergies  Allergen Reactions  . Macrobid [Nitrofurantoin Macrocrystal]     Chief Complaint  Patient presents with  . Medical Management of Chronic Issues    6 month medication management blood pressure, anxiety, thyroid    HPI:   Essential hypertension - elevated SBP at times. No associated symptoms.  Hyperglycemia - well controlled. No medications  Hypothyroidism, unspecified type - rise in TSH. She says she is taking medication daily.  Memory impairment - mild  Primary osteoarthritis of right knee - improved    Medications: Patient's Medications  New Prescriptions   No medications on file  Previous Medications   AMLODIPINE-BENAZEPRIL (LOTREL) 5-10 MG CAPSULE    TAKE 1 CAPSULE BY MOUTH DAILY FOR BLOOD PRESSURE   CHLORDIAZEPOXIDE (LIBRIUM) 10 MG CAPSULE    Take one capsule daily as needed for rest or nerves   HYDROCHLOROTHIAZIDE (HYDRODIURIL) 25 MG TABLET    TAKE 1 TABLET BY MOUTH DAILY FOR BLOOD PRESSURE   LECITHIN 1200 MG CAPS    Take by mouth. Once daily   LEVOTHYROXINE (SYNTHROID, LEVOTHROID) 88 MCG TABLET    TAKE 1 TABLET BY MOUTH DAILY FOR THYROID SUPPLEMENT   METOPROLOL SUCCINATE (TOPROL-XL) 50 MG 24 HR TABLET    TAKE 1 TABLET BY MOUTH DAILY TO CONTROL BLOOD PRESSURE AND HEART RHYTHM.(TAKE WITH OR IMMEDIATELY FOLLOWING A MEAL)  Modified Medications   No medications on file  Discontinued Medications   DICLOFENAC SODIUM (VOLTAREN) 1 % GEL    Apply 4 g topically 4 (four) times daily. To right knee    Review of Systems  Constitutional: Negative.   HENT: Negative.   Eyes: Negative.   Respiratory: Negative.   Cardiovascular: Positive for palpitations (PAC). Negative for chest pain and leg swelling.  Gastrointestinal: Positive for constipation.  Endocrine: Negative for cold intolerance, heat intolerance, polydipsia, polyphagia and polyuria.       Elevated glucose to 120 in 2010 and 126 on 10/10/14    Genitourinary:       Leakage.  Musculoskeletal:       Right knee pains have improved.  Skin: Negative.   Allergic/Immunologic: Negative.   Neurological: Negative.   Hematological: Negative.   Psychiatric/Behavioral: Positive for dysphoric mood and sleep disturbance. The patient is nervous/anxious.        Memory is getting worse peer patient. Lives at [place]. Does her own cooking. Son manages her finances.     Vitals:   02/20/16 1429  BP: (!) 170/90  Pulse: 68  Temp: 98.3 F (36.8 C)  TempSrc: Oral  SpO2: 97%  Weight: 145 lb (65.8 kg)  Height: _0  (1.499 m)   Body mass index is 29.29 kg/m. Wt Readings from Last 3 Encounters:  02/20/16 145 lb (65.8 kg)  08/09/15 144 lb (65.3 kg)  01/31/15 149 lb 3.2 oz (67.7 kg)      Physical Exam  Constitutional: She is oriented to person, place, and time. She appears well-nourished. No distress.  Frail.  HENT:  Head: Normocephalic and atraumatic.  Right Ear: External ear normal.  Nose: Nose normal.  Eyes:  Corrective lenses.  Neck: Neck supple. No JVD present. No tracheal deviation present. No thyromegaly present.  Cardiovascular: Normal rate, regular rhythm, normal heart sounds and intact distal pulses.  Exam reveals no gallop and no friction rub.   No murmur heard. Varicose veins. PAC  Pulmonary/Chest: Effort normal and breath sounds normal. No respiratory  distress. She has no wheezes. She has no rales. She exhibits no tenderness.  Left mastectomy  Abdominal: Bowel sounds are normal. She exhibits no distension and no mass. There is no tenderness.  Musculoskeletal: Normal range of motion. She exhibits no edema or tenderness.  Diffuse OA changes in hands. Right knee has small effusion and a swollen compared to the left. Nontender. Unstable gait. Using four-wheel walker with brakes and seat.  Lymphadenopathy:    She has no cervical adenopathy.  Neurological: She is alert and oriented to person, place, and time. No cranial  nerve deficit. Coordination normal.  01/31/15 MMSE 24/30. Impaired clock drawing. 02/19/16 MMSE 28/30. Impaired clock drawing.  Skin: No rash noted. No erythema. No pallor.  Psychiatric: She has a normal mood and affect. Her behavior is normal. Thought content normal.    Labs reviewed: Lab Summary Latest Ref Rng & Units 02/19/2016 08/07/2015 10/10/2014  Hemoglobin 11.1 - 15.9 g/dL (None) (None) 12.2  Hematocrit 34.0 - 46.6 % (None) (None) 36.1  White count 3.4 - 10.8 x10E3/uL (None) (None) 5.3  Platelet count 150 - 379 x10E3/uL (None) (None) 238  Sodium 135 - 146 mmol/L 139 138 140  Potassium 3.5 - 5.3 mmol/L 4.0 3.6 3.7  Calcium 8.6 - 10.4 mg/dL 9.1 9.5 9.3  Phosphorus - (None) (None) (None)  Creatinine 0.60 - 0.88 mg/dL 1.68(H) 1.58(H) 1.49(H)  AST 10 - 35 U/L 20 20 (None)  Alk Phos 33 - 130 U/L 72 84 (None)  Bilirubin 0.2 - 1.2 mg/dL 0.6 0.6 (None)  Glucose 65 - 99 mg/dL 89 109(H) 126(H)  Cholesterol 125 - 200 mg/dL 172 (None) (None)  HDL cholesterol >=46 mg/dL 53 (None) (None)  Triglycerides <150 mg/dL 132 (None) (None)  LDL Direct - (None) (None) (None)  LDL Calc <130 mg/dL 93 (None) (None)  Total protein 6.1 - 8.1 g/dL 6.4 (None) (None)  Albumin 3.6 - 5.1 g/dL 3.7 4.1 (None)  Some recent data might be hidden   Lab Results  Component Value Date   TSH 7.44 (H) 02/19/2016   TSH 3.180 08/07/2015   TSH 3.330 10/10/2014   Lab Results  Component Value Date   BUN 21 02/19/2016   BUN 23 08/07/2015   BUN 32 10/10/2014   Lab Results  Component Value Date   HGBA1C 5.9 (H) 02/19/2016   HGBA1C 5.9 (H) 08/07/2015   HGBA1C 6.5 (H) 12/27/2013    Assessment/Plan 1. Essential hypertension Continue current medication - Basic metabolic panel; Future  2. Hyperglycemia Avoid sweets - Hemoglobin A1c; Future - Basic metabolic panel; Future  3. Hypothyroidism, unspecified type Continue current levothyroxine 88 mcg qd - TSH; Future  4. Memory impairment stable  5. Primary  osteoarthritis of right knee Improved pains

## 2016-05-09 DIAGNOSIS — Z9012 Acquired absence of left breast and nipple: Secondary | ICD-10-CM | POA: Diagnosis not present

## 2016-05-09 DIAGNOSIS — M1711 Unilateral primary osteoarthritis, right knee: Secondary | ICD-10-CM | POA: Diagnosis not present

## 2016-05-09 DIAGNOSIS — R413 Other amnesia: Secondary | ICD-10-CM | POA: Diagnosis not present

## 2016-05-09 DIAGNOSIS — N182 Chronic kidney disease, stage 2 (mild): Secondary | ICD-10-CM | POA: Diagnosis not present

## 2016-05-09 DIAGNOSIS — R2689 Other abnormalities of gait and mobility: Secondary | ICD-10-CM | POA: Diagnosis not present

## 2016-05-09 DIAGNOSIS — Z853 Personal history of malignant neoplasm of breast: Secondary | ICD-10-CM | POA: Diagnosis not present

## 2016-05-09 DIAGNOSIS — M6281 Muscle weakness (generalized): Secondary | ICD-10-CM | POA: Diagnosis not present

## 2016-05-09 DIAGNOSIS — I129 Hypertensive chronic kidney disease with stage 1 through stage 4 chronic kidney disease, or unspecified chronic kidney disease: Secondary | ICD-10-CM | POA: Diagnosis not present

## 2016-05-10 DIAGNOSIS — R2689 Other abnormalities of gait and mobility: Secondary | ICD-10-CM | POA: Diagnosis not present

## 2016-05-10 DIAGNOSIS — R413 Other amnesia: Secondary | ICD-10-CM | POA: Diagnosis not present

## 2016-05-10 DIAGNOSIS — M1711 Unilateral primary osteoarthritis, right knee: Secondary | ICD-10-CM | POA: Diagnosis not present

## 2016-05-10 DIAGNOSIS — I129 Hypertensive chronic kidney disease with stage 1 through stage 4 chronic kidney disease, or unspecified chronic kidney disease: Secondary | ICD-10-CM | POA: Diagnosis not present

## 2016-05-10 DIAGNOSIS — N182 Chronic kidney disease, stage 2 (mild): Secondary | ICD-10-CM | POA: Diagnosis not present

## 2016-05-10 DIAGNOSIS — M6281 Muscle weakness (generalized): Secondary | ICD-10-CM | POA: Diagnosis not present

## 2016-08-14 ENCOUNTER — Ambulatory Visit (INDEPENDENT_AMBULATORY_CARE_PROVIDER_SITE_OTHER): Payer: Medicare Other

## 2016-08-14 ENCOUNTER — Other Ambulatory Visit: Payer: Medicare Other

## 2016-08-14 VITALS — BP 150/80 | HR 74 | Temp 97.4°F | Ht 59.0 in | Wt 142.0 lb

## 2016-08-14 DIAGNOSIS — I10 Essential (primary) hypertension: Secondary | ICD-10-CM | POA: Diagnosis not present

## 2016-08-14 DIAGNOSIS — R739 Hyperglycemia, unspecified: Secondary | ICD-10-CM

## 2016-08-14 DIAGNOSIS — E039 Hypothyroidism, unspecified: Secondary | ICD-10-CM | POA: Diagnosis not present

## 2016-08-14 DIAGNOSIS — Z0000 Encounter for general adult medical examination without abnormal findings: Secondary | ICD-10-CM

## 2016-08-14 LAB — BASIC METABOLIC PANEL
BUN: 24 mg/dL (ref 7–25)
CALCIUM: 9.3 mg/dL (ref 8.6–10.4)
CHLORIDE: 104 mmol/L (ref 98–110)
CO2: 27 mmol/L (ref 20–31)
CREATININE: 1.65 mg/dL — AB (ref 0.60–0.88)
GLUCOSE: 92 mg/dL (ref 65–99)
Potassium: 3.9 mmol/L (ref 3.5–5.3)
Sodium: 140 mmol/L (ref 135–146)

## 2016-08-14 NOTE — Progress Notes (Signed)
Quick Notes   Health Maintenance: Pt due for TDAP but declined.     Abnormal Screen: MMSE 24/30. Did not pass clock drawing.     Patient Concerns: None     Nurse Concerns: None  I have reviewed the information entered by the Health Advisor. I was present in the office during the time of patient interaction and was available for consultation. I agree with the documentation and advice.  Viviann Spare Nyoka Cowden, MD

## 2016-08-14 NOTE — Patient Instructions (Signed)
Diana Anthony , Thank you for taking time to come for your Medicare Wellness Visit. I appreciate your ongoing commitment to your health goals. Please review the following plan we discussed and let me know if I can assist you in the future.   Screening recommendations/referrals: Colonoscopy up to date. Mammogram up to date. Bone Density up to date. Recommended yearly ophthalmology/optometry visit for glaucoma screening and checkup Recommended yearly dental visit for hygiene and checkup  Vaccinations: Influenza vaccine up to date. Due 02/2017 Pneumococcal vaccine up to date. Tdap vaccine due since 2011. You declined but if you decide you want it call us and we will put in order. Shingles vaccine up to date.  Advanced directives: Advance directive discussed with you today. Please bring in your copy next time you come so we can scan it into your chart.   Conditions/risks identified: None  Next appointment: Dr. Nyoka Cowden 4/4 @ 9:15am   Preventive Care 65 Years and Older, Female Preventive care refers to lifestyle choices and visits with your health care provider that can promote health and wellness. What does preventive care include?  A yearly physical exam. This is also called an annual well check.  Dental exams once or twice a year.  Routine eye exams. Ask your health care provider how often you should have your eyes checked.  Personal lifestyle choices, including:  Daily care of your teeth and gums.  Regular physical activity.  Eating a healthy diet.  Avoiding tobacco and drug use.  Limiting alcohol use.  Practicing safe sex.  Taking low-dose aspirin every day.  Taking vitamin and mineral supplements as recommended by your health care provider. What happens during an annual well check? The services and screenings done by your health care provider during your annual well check will depend on your age, overall health, lifestyle risk factors, and family history of  disease. Counseling  Your health care provider may ask you questions about your:  Alcohol use.  Tobacco use.  Drug use.  Emotional well-being.  Home and relationship well-being.  Sexual activity.  Eating habits.  History of falls.  Memory and ability to understand (cognition).  Work and work Statistician.  Reproductive health. Screening  You may have the following tests or measurements:  Height, weight, and BMI.  Blood pressure.  Lipid and cholesterol levels. These may be checked every 5 years, or more frequently if you are over 81 years old.  Skin check.  Lung cancer screening. You may have this screening every year starting at age 81 if you have a 30-pack-year history of smoking and currently smoke or have quit within the past 15 years.  Fecal occult blood test (FOBT) of the stool. You may have this test every year starting at age 81.  Flexible sigmoidoscopy or colonoscopy. You may have a sigmoidoscopy every 5 years or a colonoscopy every 10 years starting at age 81.  Hepatitis C blood test.  Hepatitis B blood test.  Sexually transmitted disease (STD) testing.  Diabetes screening. This is done by checking your blood sugar (glucose) after you have not eaten for a while (fasting). You may have this done every 1-3 years.  Bone density scan. This is done to screen for osteoporosis. You may have this done starting at age 81.  Mammogram. This may be done every 1-2 years. Talk to your health care provider about how often you should have regular mammograms. Talk with your health care provider about your test results, treatment options, and if necessary, the need  for more tests. Vaccines  Your health care provider may recommend certain vaccines, such as:  Influenza vaccine. This is recommended every year.  Tetanus, diphtheria, and acellular pertussis (Tdap, Td) vaccine. You may need a Td booster every 10 years.  Zoster vaccine. You may need this after age  81.  Pneumococcal 13-valent conjugate (PCV13) vaccine. One dose is recommended after age 25.  Pneumococcal polysaccharide (PPSV23) vaccine. One dose is recommended after age 81. Talk to your health care provider about which screenings and vaccines you need and how often you need them. This information is not intended to replace advice given to you by your health care provider. Make sure you discuss any questions you have with your health care provider. Document Released: 06/02/2015 Document Revised: 01/24/2016 Document Reviewed: 03/07/2015 Elsevier Interactive Patient Education  2017 [place] Prevention in the Home Falls can cause injuries. They can happen to people of all ages. There are many things you can do to make your home safe and to help prevent falls. What can I do on the outside of my home?  Regularly fix the edges of walkways and driveways and fix any cracks.  Remove anything that might make you trip as you walk through a door, such as a raised step or threshold.  Trim any bushes or trees on the path to your home.  Use bright outdoor lighting.  Clear any walking paths of anything that might make someone trip, such as rocks or tools.  Regularly check to see if handrails are loose or broken. Make sure that both sides of any steps have handrails.  Any raised decks and porches should have guardrails on the edges.  Have any leaves, snow, or ice cleared regularly.  Use sand or salt on walking paths during winter.  Clean up any spills in your garage right away. This includes oil or grease spills. What can I do in the bathroom?  Use night lights.  Install grab bars by the toilet and in the tub and shower. Do not use towel bars as grab bars.  Use non-skid mats or decals in the tub or shower.  If you need to sit down in the shower, use a plastic, non-slip stool.  Keep the floor dry. Clean up any water that spills on the floor as soon as it happens.  Remove  soap buildup in the tub or shower regularly.  Attach bath mats securely with double-sided non-slip rug tape.  Do not have throw rugs and other things on the floor that can make you trip. What can I do in the bedroom?  Use night lights.  Make sure that you have a light by your bed that is easy to reach.  Do not use any sheets or blankets that are too big for your bed. They should not hang down onto the floor.  Have a firm chair that has side arms. You can use this for support while you get dressed.  Do not have throw rugs and other things on the floor that can make you trip. What can I do in the kitchen?  Clean up any spills right away.  Avoid walking on wet floors.  Keep items that you use a lot in easy-to-reach places.  If you need to reach something above you, use a strong step stool that has a grab bar.  Keep electrical cords out of the way.  Do not use floor polish or wax that makes floors slippery. If you must use wax, use  non-skid floor wax.  Do not have throw rugs and other things on the floor that can make you trip. What can I do with my stairs?  Do not leave any items on the stairs.  Make sure that there are handrails on both sides of the stairs and use them. Fix handrails that are broken or loose. Make sure that handrails are as long as the stairways.  Check any carpeting to make sure that it is firmly attached to the stairs. Fix any carpet that is loose or worn.  Avoid having throw rugs at the top or bottom of the stairs. If you do have throw rugs, attach them to the floor with carpet tape.  Make sure that you have a light switch at the top of the stairs and the bottom of the stairs. If you do not have them, ask someone to add them for you. What else can I do to help prevent falls?  Wear shoes that:  Do not have high heels.  Have rubber bottoms.  Are comfortable and fit you well.  Are closed at the toe. Do not wear sandals.  If you use a  stepladder:  Make sure that it is fully opened. Do not climb a closed stepladder.  Make sure that both sides of the stepladder are locked into place.  Ask someone to hold it for you, if possible.  Clearly mark and make sure that you can see:  Any grab bars or handrails.  First and last steps.  Where the edge of each step is.  Use tools that help you move around (mobility aids) if they are needed. These include:  Canes.  Walkers.  Scooters.  Crutches.  Turn on the lights when you go into a dark area. Replace any light bulbs as soon as they burn out.  Set up your furniture so you have a clear path. Avoid moving your furniture around.  If any of your floors are uneven, fix them.  If there are any pets around you, be aware of where they are.  Review your medicines with your doctor. Some medicines can make you feel dizzy. This can increase your chance of falling. Ask your doctor what other things that you can do to help prevent falls. This information is not intended to replace advice given to you by your health care provider. Make sure you discuss any questions you have with your health care provider. Document Released: 03/02/2009 Document Revised: 10/12/2015 Document Reviewed: 06/10/2014 Elsevier Interactive Patient Education  2017 [employer].

## 2016-08-14 NOTE — Progress Notes (Signed)
Subjective:   Diana Anthony is a 83 y.o. female who presents for an Initial Medicare Annual Wellness Visit. Cardiac Risk Factors include: advanced age (>39men, >42 women);hypertension;sedentary lifestyle;family history of premature cardiovascular disease     Objective:    Today's Vitals   08/14/16 0923  BP: (!) 150/80  Pulse: 74  Temp: 97.4 F (36.3 C)  TempSrc: Oral  SpO2: 93%  Weight: 142 lb (64.4 kg)  Height: 4\' 11"  (1.499 m)   Body mass index is 28.68 kg/m.   Current Medications (verified) Outpatient Encounter Prescriptions as of 08/14/2016  Medication Sig  . amLODipine-benazepril (LOTREL) 5-10 MG capsule TAKE 1 CAPSULE BY MOUTH DAILY FOR BLOOD PRESSURE  . chlordiazePOXIDE (LIBRIUM) 10 MG capsule Take one capsule daily as needed for rest or nerves  . hydrochlorothiazide (HYDRODIURIL) 25 MG tablet TAKE 1 TABLET BY MOUTH DAILY FOR BLOOD PRESSURE  . Lecithin 1200 MG CAPS Take by mouth. Once daily  . levothyroxine (SYNTHROID, LEVOTHROID) 88 MCG tablet TAKE 1 TABLET BY MOUTH DAILY FOR THYROID SUPPLEMENT  . metoprolol succinate (TOPROL-XL) 50 MG 24 hr tablet TAKE 1 TABLET BY MOUTH DAILY TO CONTROL BLOOD PRESSURE AND HEART RHYTHM.(TAKE WITH OR IMMEDIATELY FOLLOWING A MEAL)   No facility-administered encounter medications on file as of 08/14/2016.     Allergies (verified) Macrobid [nitrofurantoin macrocrystal]   History: Past Medical History:  Diagnosis Date  . Anxiety state, unspecified   . Dysthymic disorder   . First degree atrioventricular block   . Hyperglycemia 01/30/09  . Malignant neoplasm of breast (female), unspecified site   . Osteoarthrosis, unspecified whether generalized or localized, unspecified site   . Senile osteoporosis   . Trigger finger (acquired)   . Unspecified constipation   . Unspecified essential hypertension   . Unspecified hypothyroidism   . Unspecified vitamin D deficiency    Past Surgical History:  Procedure Laterality Date  .  CATARACT EXTRACTION BILATERAL W/ ANTERIOR VITRECTOMY Bilateral 2002  . MASTECTOMY Left 1986   Dr.Weatherly   . ORIF FACIAL FRACTURE  1962   Family History  Problem Relation Age of Onset  . Cerebrovascular Accident Mother   . Hypertension Mother   . Cerebrovascular Accident Father   . Heart disease Sister   . Emphysema Brother    Social History   Occupational History  . Not on file.   Social History Main Topics  . Smoking status: Never Smoker  . Smokeless tobacco: Never Used  . Alcohol use No  . Drug use: No  . Sexual activity: No    Tobacco Counseling Counseling given: Not Answered   Activities of Daily Living In your present state of health, do you have any difficulty performing the following activities: 08/14/2016  Hearing? Y  Vision? N  Difficulty concentrating or making decisions? N  Walking or climbing stairs? N  Dressing or bathing? N  Doing errands, shopping? Y  Preparing Food and eating ? N  Using the Toilet? N  In the past six months, have you accidently leaked urine? N  Do you have problems with loss of bowel control? N  Managing your Medications? Y  Managing your Finances? N  Housekeeping or managing your Housekeeping? N  Some recent data might be hidden    Immunizations and Health Maintenance Immunization History  Administered Date(s) Administered  . Influenza,inj,Quad PF,36+ Mos 02/18/2014, 01/31/2015, 02/20/2016  . Influenza-Unspecified 02/01/2009, 02/06/2010, 02/12/2011  . Pneumococcal Conjugate-13 08/09/2015  . Pneumococcal Polysaccharide-23 03/25/2000  . Td 03/25/2000   There are no  preventive care reminders to display for this patient.  Patient Care Team: [name], MD as PCP - General (Internal Medicine) [name], MD as Consulting Physician (Orthopedic Surgery) [name], MD as Attending Physician (General Surgery) [name], MD as Consulting Physician (Ophthalmology)  Indicate any recent Medical Services you  may have received from other than [facility] providers in the past year (date may be approximate).     Assessment:   This is a routine wellness examination for Diana Anthony.   Hearing/Vision screen No exam data present  Dietary issues and exercise activities discussed: Current Exercise Habits: The patient does not participate in regular exercise at present, Exercise limited by: None identified  Goals    . maintain lifestyle          Starting 08/14/16 I will maintain my current lifestyle       Depression Screen PHQ 2/9 Scores 08/14/2016 02/20/2016 01/31/2015 11/04/2014 10/10/2014 02/24/2014 01/03/2014  PHQ - 2 Score 0 1 0 0 0 0 0  PHQ- 9 Score - - - - - - -    Fall Risk Fall Risk  08/14/2016 02/20/2016 01/31/2015 11/04/2014 10/10/2014  Falls in the past year? No Yes No No No  Number falls in past yr: - 1 - - -  Injury with Fall? - No - - -  Risk for fall due to : - - Impaired balance/gait - -    Cognitive Function: MMSE - Mini Mental State Exam 08/14/2016 01/31/2015  Orientation to time 3 3  Orientation to Place 4 5  Registration 3 3  Attention/ Calculation 5 5  Recall 1 0  Language- name 2 objects 2 2  Language- repeat 1 1  Language- follow 3 step command 3 3  Language- read & follow direction 1 1  Write a sentence 1 1  Copy design 0 0  Total score 24 24        Screening Tests Health Maintenance  Topic Date Due  . TETANUS/TDAP  05/20/2018 (Originally 03/25/2010)  . INFLUENZA VACCINE  Completed  . DEXA SCAN  Completed  . PNA vac Low Risk Adult  Completed      Plan:  I have personally reviewed and addressed the Medicare Annual Wellness questionnaire and have noted the following in the patient's chart:  A. Medical and social history B. Use of alcohol, tobacco or illicit drugs  C. Current medications and supplements D. Functional ability and status E.  Nutritional status F.  Physical activity G. Advance directives H. List of other physicians I.  Hospitalizations, surgeries, and  ER visits in previous 12 months J.  [place] to include hearing, vision, cognitive, depression L. Referrals and appointments - none  In addition, I have reviewed and discussed with patient certain preventive protocols, quality metrics, and best practice recommendations. A written personalized care plan for preventive services as well as general preventive health recommendations were provided to patient.  See attached scanned questionnaire for additional information.   Signed,   Rich Reining, RN Nurse Health Advisor    I have reviewed the information entered by the [facility]. I was present in the office during the time of patient interaction and was available for consultation. I agree with the documentation and advice.  Viviann Spare Nyoka Cowden, MD

## 2016-08-15 ENCOUNTER — Ambulatory Visit: Payer: Medicare Other

## 2016-08-15 ENCOUNTER — Other Ambulatory Visit: Payer: Medicare Other

## 2016-08-15 LAB — TSH: TSH: 4.48 mIU/L

## 2016-08-15 LAB — HEMOGLOBIN A1C
Hgb A1c MFr Bld: 5.6 % (ref <5.7)
Mean Plasma Glucose: 114 mg/dL

## 2016-08-19 ENCOUNTER — Other Ambulatory Visit: Payer: Self-pay | Admitting: Internal Medicine

## 2016-08-19 DIAGNOSIS — I10 Essential (primary) hypertension: Secondary | ICD-10-CM

## 2016-08-21 ENCOUNTER — Ambulatory Visit (INDEPENDENT_AMBULATORY_CARE_PROVIDER_SITE_OTHER): Payer: Medicare Other | Admitting: Internal Medicine

## 2016-08-21 ENCOUNTER — Encounter: Payer: Self-pay | Admitting: Internal Medicine

## 2016-08-21 VITALS — BP 120/84 | HR 63 | Temp 98.0°F | Ht 59.0 in | Wt 141.0 lb

## 2016-08-21 DIAGNOSIS — F411 Generalized anxiety disorder: Secondary | ICD-10-CM | POA: Diagnosis not present

## 2016-08-21 DIAGNOSIS — E039 Hypothyroidism, unspecified: Secondary | ICD-10-CM | POA: Diagnosis not present

## 2016-08-21 DIAGNOSIS — I10 Essential (primary) hypertension: Secondary | ICD-10-CM | POA: Diagnosis not present

## 2016-08-21 DIAGNOSIS — R413 Other amnesia: Secondary | ICD-10-CM

## 2016-08-21 DIAGNOSIS — N182 Chronic kidney disease, stage 2 (mild): Secondary | ICD-10-CM | POA: Diagnosis not present

## 2016-08-21 DIAGNOSIS — R739 Hyperglycemia, unspecified: Secondary | ICD-10-CM | POA: Diagnosis not present

## 2016-08-21 NOTE — Progress Notes (Signed)
Facility  [place]    Place of Service:   OFFICE    Allergies  Allergen Reactions  . Macrobid [Nitrofurantoin Macrocrystal]     Chief Complaint  Patient presents with  . Medical Management of Chronic Issues    6 month medication management blood pressure, anxiety, thyroid, review labs  . hearding    worse    HPI:  Chronic kidney disease (CKD) stage G2/A2, mildly decreased glomerular filtration rate (GFR) between 60-89 mL/min/1.73 square meter and albuminuria creatinine ratio between 30-299 mg/g - stable  Essential hypertension - controlled  Anxiety state - some episodes  Hyperglycemia - controlled by diet  Hypothyroidism, unspecified type - compensated  Memory impairment - follow up MMSE next visit    Medications: Patient's Medications  New Prescriptions   No medications on file  Previous Medications   AMLODIPINE-BENAZEPRIL (LOTREL) 5-10 MG CAPSULE    TAKE 1 CAPSULE BY MOUTH DAILY FOR BLOOD PRESSURE   CHLORDIAZEPOXIDE (LIBRIUM) 10 MG CAPSULE    Take one capsule daily as needed for rest or nerves   HYDROCHLOROTHIAZIDE (HYDRODIURIL) 25 MG TABLET    TAKE 1 TABLET BY MOUTH DAILY FOR BLOOD PRESSURE   LECITHIN 1200 MG CAPS    Take by mouth. Once daily   LEVOTHYROXINE (SYNTHROID, LEVOTHROID) 88 MCG TABLET    TAKE 1 TABLET BY MOUTH DAILY FOR THYROID SUPPLEMENT   METOPROLOL SUCCINATE (TOPROL-XL) 50 MG 24 HR TABLET    TAKE 1 TABLET BY MOUTH DAILY TO CONTROL BLOOD PRESSURE AND HEART RHYTHM.(TAKE WITH OR IMMEDIATELY FOLLOWING A MEAL)  Modified Medications   No medications on file  Discontinued Medications   No medications on file    Review of Systems  Constitutional: Negative.   HENT: Negative.   Eyes: Negative.   Respiratory: Negative.   Cardiovascular: Positive for palpitations (PAC). Negative for chest pain and leg swelling.  Gastrointestinal: Positive for constipation.  Endocrine: Negative for cold intolerance, heat intolerance, polydipsia, polyphagia and polyuria.         Elevated glucose to 120 in 2010 and 126 on 10/10/14  Genitourinary:       Leakage.  Musculoskeletal:       Right knee pains have improved.  Skin: Negative.   Allergic/Immunologic: Negative.   Neurological: Negative.   Hematological: Negative.   Psychiatric/Behavioral: Positive for dysphoric mood and sleep disturbance. The patient is nervous/anxious.        Memory is getting worse peer patient. Lives at [facility]. Does her own cooking. Son manages her finances.     Vitals:   08/21/16 0928  BP: 120/84  Pulse: 63  Temp: 98 F (36.7 C)  TempSrc: Oral  SpO2: 96%  Weight: 141 lb (64 kg)  Height: '4\' 11"'  (1.499 m)   Body mass index is 28.48 kg/m. Wt Readings from Last 3 Encounters:  08/21/16 141 lb (64 kg)  08/14/16 142 lb (64.4 kg)  02/20/16 145 lb (65.8 kg)      Physical Exam  Constitutional: She is oriented to person, place, and time. She appears well-nourished. No distress.  Frail.  HENT:  Head: Normocephalic and atraumatic.  Right Ear: External ear normal.  Nose: Nose normal.  Eyes:  Corrective lenses.  Neck: Neck supple. No JVD present. No tracheal deviation present. No thyromegaly present.  Cardiovascular: Normal rate, regular rhythm, normal heart sounds and intact distal pulses.  Exam reveals no gallop and no friction rub.   No murmur heard. Varicose veins. PAC  Pulmonary/Chest: Effort normal and breath sounds normal.  No respiratory distress. She has no wheezes. She has no rales. She exhibits no tenderness.  Left mastectomy  Abdominal: Bowel sounds are normal. She exhibits no distension and no mass. There is no tenderness.  Musculoskeletal: Normal range of motion. She exhibits no edema or tenderness.  Diffuse OA changes in hands. Right knee has small effusion and a swollen compared to the left. Nontender. Unstable gait. Using four-wheel walker with brakes and seat.  Lymphadenopathy:    She has no cervical adenopathy.  Neurological: She is alert and  oriented to person, place, and time. No cranial nerve deficit. Coordination normal.  01/31/15 MMSE 24/30. Impaired clock drawing. 02/19/16 MMSE 28/30. Impaired clock drawing.  Skin: No rash noted. No erythema. No pallor.  Psychiatric: She has a normal mood and affect. Her behavior is normal. Thought content normal.    Labs reviewed: Lab Summary Latest Ref Rng & Units 08/14/2016 02/19/2016 08/07/2015 10/10/2014  Hemoglobin 11.1 - 15.9 g/dL (None) (None) (None) 12.2  Hematocrit 34.0 - 46.6 % (None) (None) (None) 36.1  White count 3.4 - 10.8 x10E3/uL (None) (None) (None) 5.3  Platelet count 150 - 379 x10E3/uL (None) (None) (None) 238  Sodium 135 - 146 mmol/L 140 139 138 140  Potassium 3.5 - 5.3 mmol/L 3.9 4.0 3.6 3.7  Calcium 8.6 - 10.4 mg/dL 9.3 9.1 9.5 9.3  Phosphorus - (None) (None) (None) (None)  Creatinine 0.60 - 0.88 mg/dL 1.65(H) 1.68(H) 1.58(H) 1.49(H)  AST 10 - 35 U/L (None) 20 20 (None)  Alk Phos 33 - 130 U/L (None) 72 84 (None)  Bilirubin 0.2 - 1.2 mg/dL (None) 0.6 0.6 (None)  Glucose 65 - 99 mg/dL 92 89 109(H) 126(H)  Cholesterol 125 - 200 mg/dL (None) 172 (None) (None)  HDL cholesterol >=46 mg/dL (None) 53 (None) (None)  Triglycerides <150 mg/dL (None) 132 (None) (None)  LDL Direct - (None) (None) (None) (None)  LDL Calc <130 mg/dL (None) 93 (None) (None)  Total protein 6.1 - 8.1 g/dL (None) 6.4 (None) (None)  Albumin 3.6 - 5.1 g/dL (None) 3.7 4.1 (None)  Some recent data might be hidden   Lab Results  Component Value Date   TSH 4.48 08/14/2016   TSH 7.44 (H) 02/19/2016   TSH 3.180 08/07/2015   Lab Results  Component Value Date   BUN 24 08/14/2016   BUN 21 02/19/2016   BUN 23 08/07/2015   Lab Results  Component Value Date   HGBA1C 5.6 08/14/2016   HGBA1C 5.9 (H) 02/19/2016   HGBA1C 5.9 (H) 08/07/2015    Assessment/Plan  1. Chronic kidney disease (CKD) stage G2/A2, mildly decreased glomerular filtration rate (GFR) between 60-89 mL/min/1.73 square meter and  albuminuria creatinine ratio between 30-299 mg/g - Basic metabolic panel; Future  2. Essential hypertension The current medical regimen is effective;  continue present plan and medications. - Basic metabolic panel; Future  3. Anxiety state The current medical regimen is effective;  continue present plan and medications.  4. Hyperglycemia - Basic metabolic panel; Future  5. Hypothyroidism, unspecified type The current medical regimen is effective;  continue present plan and medications. - TSH; Future  6. Memory impairment MMSE next visit

## 2016-10-18 ENCOUNTER — Emergency Department (HOSPITAL_COMMUNITY): Payer: Medicare Other

## 2016-10-18 ENCOUNTER — Emergency Department (HOSPITAL_COMMUNITY)
Admission: EM | Admit: 2016-10-18 | Discharge: 2016-10-19 | Disposition: A | Discharge status: Home / Self Care | Payer: Medicare Other | Attending: Emergency Medicine | Admitting: Emergency Medicine

## 2016-10-18 ENCOUNTER — Encounter (HOSPITAL_COMMUNITY): Payer: Self-pay

## 2016-10-18 DIAGNOSIS — Y9301 Activity, walking, marching and hiking: Secondary | ICD-10-CM | POA: Diagnosis not present

## 2016-10-18 DIAGNOSIS — I129 Hypertensive chronic kidney disease with stage 1 through stage 4 chronic kidney disease, or unspecified chronic kidney disease: Secondary | ICD-10-CM | POA: Diagnosis not present

## 2016-10-18 DIAGNOSIS — Y999 Unspecified external cause status: Secondary | ICD-10-CM | POA: Insufficient documentation

## 2016-10-18 DIAGNOSIS — I4891 Unspecified atrial fibrillation: Secondary | ICD-10-CM

## 2016-10-18 DIAGNOSIS — N182 Chronic kidney disease, stage 2 (mild): Secondary | ICD-10-CM | POA: Diagnosis not present

## 2016-10-18 DIAGNOSIS — W010XXA Fall on same level from slipping, tripping and stumbling without subsequent striking against object, initial encounter: Secondary | ICD-10-CM | POA: Diagnosis not present

## 2016-10-18 DIAGNOSIS — Y92129 Unspecified place in nursing home as the place of occurrence of the external cause: Secondary | ICD-10-CM | POA: Diagnosis not present

## 2016-10-18 DIAGNOSIS — R531 Weakness: Secondary | ICD-10-CM | POA: Diagnosis not present

## 2016-10-18 DIAGNOSIS — Z853 Personal history of malignant neoplasm of breast: Secondary | ICD-10-CM | POA: Insufficient documentation

## 2016-10-18 DIAGNOSIS — Z79899 Other long term (current) drug therapy: Secondary | ICD-10-CM | POA: Insufficient documentation

## 2016-10-18 DIAGNOSIS — R404 Transient alteration of awareness: Secondary | ICD-10-CM | POA: Diagnosis not present

## 2016-10-18 DIAGNOSIS — E039 Hypothyroidism, unspecified: Secondary | ICD-10-CM | POA: Insufficient documentation

## 2016-10-18 DIAGNOSIS — I517 Cardiomegaly: Secondary | ICD-10-CM | POA: Diagnosis not present

## 2016-10-18 DIAGNOSIS — I10 Essential (primary) hypertension: Secondary | ICD-10-CM

## 2016-10-18 LAB — BASIC METABOLIC PANEL
Anion gap: 11 (ref 5–15)
BUN: 23 mg/dL — AB (ref 6–20)
CALCIUM: 8.8 mg/dL — AB (ref 8.9–10.3)
CHLORIDE: 102 mmol/L (ref 101–111)
CO2: 24 mmol/L (ref 22–32)
CREATININE: 1.58 mg/dL — AB (ref 0.44–1.00)
GFR, EST AFRICAN AMERICAN: 30 mL/min — AB (ref >60)
GFR, EST NON AFRICAN AMERICAN: 26 mL/min — AB (ref >60)
Glucose, Bld: 173 mg/dL — ABNORMAL HIGH (ref 65–99)
Potassium: 2.9 mmol/L — ABNORMAL LOW (ref 3.5–5.1)
SODIUM: 137 mmol/L (ref 135–145)

## 2016-10-18 LAB — CBC WITH DIFFERENTIAL/PLATELET
BASOS PCT: 1 %
Basophils Absolute: 0.1 10*3/uL (ref 0.0–0.1)
EOS ABS: 0.2 10*3/uL (ref 0.0–0.7)
EOS PCT: 2 %
HCT: 35.1 % — ABNORMAL LOW (ref 36.0–46.0)
HEMOGLOBIN: 11.5 g/dL — AB (ref 12.0–15.0)
Lymphocytes Relative: 27 %
Lymphs Abs: 1.7 10*3/uL (ref 0.7–4.0)
MCH: 31.3 pg (ref 26.0–34.0)
MCHC: 32.8 g/dL (ref 30.0–36.0)
MCV: 95.6 fL (ref 78.0–100.0)
MONOS PCT: 11 %
Monocytes Absolute: 0.7 10*3/uL (ref 0.1–1.0)
NEUTROS PCT: 59 %
Neutro Abs: 3.8 10*3/uL (ref 1.7–7.7)
PLATELETS: 183 10*3/uL (ref 150–400)
RBC: 3.67 MIL/uL — ABNORMAL LOW (ref 3.87–5.11)
RDW: 13.6 % (ref 11.5–15.5)
WBC: 6.5 10*3/uL (ref 4.0–10.5)

## 2016-10-18 LAB — I-STAT TROPONIN, ED: TROPONIN I, POC: 0.03 ng/mL (ref 0.00–0.08)

## 2016-10-18 MED ORDER — DILTIAZEM LOAD VIA INFUSION
20.0000 mg | Freq: Once | INTRAVENOUS | Status: AC
  Administered 2016-10-19: 20 mg via INTRAVENOUS
  Filled 2016-10-18: qty 20

## 2016-10-18 MED ORDER — DILTIAZEM HCL 100 MG IV SOLR
5.0000 mg/h | INTRAVENOUS | Status: DC
  Administered 2016-10-19: 5 mg/h via INTRAVENOUS
  Filled 2016-10-18: qty 100

## 2016-10-18 NOTE — ED Provider Notes (Addendum)
[facility] DEPT Provider Note   CSN: [id number] Arrival date & time: 10/18/16  2237 By signing my name below, I, Diana Anthony, attest that this documentation has been prepared under the direction and in the presence of Diana Schmidt, MD . Electronically Signed: Dyke Anthony, Scribe. 10/18/2016. 11:21 PM.   History   Chief Complaint Chief Complaint  Patient presents with  . Atrial Fibrillation    HPI Diana Anthony is a 83 y.o. female with a history of CKD and essential HTN, brought in by ambulance from [facility], who presents to the Emergency Department for evaluation of atrial fibrillation found tonight s/p fall. Pt was using her walker when she fell backwards, landing on her buttocks. When pt was being evaluated after the fall, pt was noted to be in A-fib. Pt reports some associated mild chest pain. No OTC treatments tried for these symptoms PTA. No alleviating or modifying factors noted. Per pt, she has not felt any irregular heartbeat. She denies any hx of A-Fib. Pt denies any recent illness and states she has been at her baseline this week. She denies any shortness of breath or palpitations. Pt also reports some mild right lateral knee pain s/p fall, but denies any other pain or injury.  The history is provided by the patient. No language interpreter was used.    Past Medical History:  Diagnosis Date  . Anxiety state, unspecified   . Dysthymic disorder   . First degree atrioventricular block   . Hyperglycemia 01/30/09  . Malignant neoplasm of breast (female), unspecified site   . Osteoarthrosis, unspecified whether generalized or localized, unspecified site   . Senile osteoporosis   . Trigger finger (acquired)   . Unspecified constipation   . Unspecified essential hypertension   . Unspecified hypothyroidism   . Unspecified vitamin D deficiency     Patient Active Problem List   Diagnosis Date Noted  . Chronic kidney disease (CKD) stage G2/A2, mildly decreased  glomerular filtration rate (GFR) between 60-89 mL/min/1.73 square meter and albuminuria creatinine ratio between 30-299 mg/g 01/31/2015  . Memory impairment 01/31/2015  . Osteoarthritis of right knee 02/24/2014  . Urinary tract infection, site not specified 12/16/2012  . Unspecified vitamin D deficiency   . Anxiety state   . History of breast cancer in female   . Hypothyroidism   . Essential hypertension   . Hyperglycemia 01/30/2009    Past Surgical History:  Procedure Laterality Date  . CATARACT EXTRACTION BILATERAL W/ ANTERIOR VITRECTOMY Bilateral 2002  . MASTECTOMY Left 1986   Dr.Weatherly   . ORIF FACIAL FRACTURE  1962    OB History    No data available       Home Medications    Prior to Admission medications   Medication Sig Start Date End Date Taking? Authorizing Provider  amLODipine-benazepril (LOTREL) 5-10 MG capsule TAKE 1 CAPSULE BY MOUTH DAILY FOR BLOOD PRESSURE 08/19/16   [name], MD  chlordiazePOXIDE (LIBRIUM) 10 MG capsule Take one capsule daily as needed for rest or nerves 01/31/15   [name], MD  hydrochlorothiazide (HYDRODIURIL) 25 MG tablet TAKE 1 TABLET BY MOUTH DAILY FOR BLOOD PRESSURE 08/19/16   [name], MD  Lecithin 1200 MG CAPS Take by mouth. Once daily    [provider]  levothyroxine (SYNTHROID, LEVOTHROID) 88 MCG tablet TAKE 1 TABLET BY MOUTH DAILY FOR THYROID SUPPLEMENT 02/20/16   [name], MD  metoprolol succinate (TOPROL-XL) 50 MG 24 hr tablet TAKE 1 TABLET  BY MOUTH DAILY TO CONTROL BLOOD PRESSURE AND HEART RHYTHM.(TAKE WITH OR IMMEDIATELY FOLLOWING A MEAL) 01/24/16   [name], MD    Family History Family History  Problem Relation Age of Onset  . Cerebrovascular Accident Mother   . Hypertension Mother   . Cerebrovascular Accident Father   . Heart disease Sister   . Emphysema Brother     Social History Social History  Substance Use Topics  . Smoking status: Never Smoker  . Smokeless tobacco: Never  Used  . Alcohol use No     Allergies   Macrobid [nitrofurantoin macrocrystal]   Review of Systems Review of Systems All systems reviewed and are negative for acute change except as noted in the HPI.  Physical Exam Updated Vital Signs BP (!) 163/80   Pulse (!) 123   Temp 97.6 F (36.4 C) (Oral)   Resp 15   SpO2 97%   Physical Exam  Constitutional: She is oriented to person, place, and time. She appears well-developed and well-nourished. No distress.  HENT:  Head: Normocephalic and atraumatic.  Eyes: EOM are normal.  Neck: Normal range of motion.  Cardiovascular: Normal heart sounds.  An irregularly irregular rhythm present. Tachycardia present.   Pulmonary/Chest: Effort normal and breath sounds normal.  Abdominal: Soft. She exhibits no distension. There is no tenderness.  Musculoskeletal: Normal range of motion.  Neurological: She is alert and oriented to person, place, and time.  Skin: Skin is warm and dry.  Psychiatric: She has a normal mood and affect. Judgment normal.  Nursing note and vitals reviewed.   ED Treatments / Results  DIAGNOSTIC STUDIES:  Oxygen Saturation is 97% on RA, normal by my interpretation.    COORDINATION OF CARE:  11:12 PM Pt declines pain medication. Will order Cardizem. Discussed treatment plan with pt at bedside and pt agreed to plan.   Labs (all labs ordered are listed, but only abnormal results are displayed) Labs Reviewed  CBC WITH DIFFERENTIAL/PLATELET - Abnormal; Notable for the following:       Result Value   RBC 3.67 (*)    Hemoglobin 11.5 (*)    HCT 35.1 (*)    All other components within normal limits  BASIC METABOLIC PANEL - Abnormal; Notable for the following:    Potassium 2.9 (*)    Glucose, Bld 173 (*)    BUN 23 (*)    Creatinine, Ser 1.58 (*)    Calcium 8.8 (*)    GFR calc non Af Amer 26 (*)    GFR calc Af Amer 30 (*)    All other components within normal limits  TSH  I-STAT TROPOININ, ED    EKG  EKG  Interpretation  Date/Time:  Friday October 18 2016 22:37:27 EDT Ventricular Rate:  128 PR Interval:    QRS Duration: 84 QT Interval:  332 QTC Calculation: 485 R Axis:   44 Text Interpretation:  Atrial fibrillation Anterior infarct, old ST depression, probably rate related No old tracing to compare Confirmed by Diana Anthony [phone]) on 10/18/2016 11:04:49 PM       Radiology Dg Chest Port 1 View  Result Date: 10/18/2016 CLINICAL DATA:  AFib EXAM: PORTABLE CHEST 1 VIEW COMPARISON:  None. FINDINGS: Surgical clips in the left axilla. Low lung volumes. Streaky atelectasis or scar at the left base. Mild diffuse coarse interstitial opacities are possibly chronic. There is cardiomegaly. No pneumothorax. High-riding right greater than left humeral head consistent with rotator cuff disease. IMPRESSION: 1. Mild cardiomegaly without overt failure  2. Streaky atelectasis or scar in the medial left base 3. Diffuse interstitial prominence may relate to chronic change. Electronically Signed   By: Donavan Foil M.D.   On: 10/18/2016 23:13    Procedures Procedures (including critical care time)  Medications Ordered in ED Medications  diltiazem (CARDIZEM) 1 mg/mL load via infusion 20 mg (20 mg Intravenous Bolus from Bag 10/19/16 0019)    And  diltiazem (CARDIZEM) 100 mg in dextrose 5 % 100 mL (1 mg/mL) infusion (5 mg/hr Intravenous New Bag/Given 10/19/16 0018)  metoprolol tartrate (LOPRESSOR) tablet 50 mg (50 mg Oral Given 10/19/16 0130)     Initial Impression / Assessment and Plan / ED Course  I have reviewed the triage vital signs and the nursing notes.  Pertinent labs & imaging results that were available during my care of the patient were reviewed by me and considered in my medical decision making (see chart for details).     Unknown how long the patient has been in atrial fibrillation.  Unrelated to her fall tonight.  Last seen in clinic in April 2018 at that time a normal heart rhythm was documented  based on auscultation.  Patient is otherwise rather a symptomatically at this time.  Given her advanced age I will let the primary team determine appropriateness of anticoagulation for her given her stroke risk score of 4.   CHADSVASC SCORE = 4    Final Clinical Impressions(s) / ED Diagnoses   Final diagnoses:  Atrial fibrillation, unspecified type [facility])    New Prescriptions New Prescriptions   No medications on file   I personally performed the services described in this documentation, which was scribed in my presence. The recorded information has been reviewed and is accurate.        Diana Schmidt, MD 10/19/16 1610    Diana Schmidt, MD 10/19/16 [phone]

## 2016-10-18 NOTE — ED Triage Notes (Signed)
Pt arrived via [facility] from [facility] c/o A-fib w/ RVR and right knee pain. EMS gave 500 NS.

## 2016-10-19 DIAGNOSIS — I4891 Unspecified atrial fibrillation: Secondary | ICD-10-CM | POA: Diagnosis not present

## 2016-10-19 LAB — URINALYSIS, ROUTINE W REFLEX MICROSCOPIC
Bilirubin Urine: NEGATIVE
GLUCOSE, UA: NEGATIVE mg/dL
HGB URINE DIPSTICK: NEGATIVE
KETONES UR: NEGATIVE mg/dL
LEUKOCYTES UA: NEGATIVE
Nitrite: POSITIVE — AB
PROTEIN: NEGATIVE mg/dL
RBC / HPF: NONE SEEN RBC/hpf (ref 0–5)
Specific Gravity, Urine: 1.008 (ref 1.005–1.030)
pH: 7 (ref 5.0–8.0)

## 2016-10-19 LAB — TSH: TSH: 5.963 u[IU]/mL — AB (ref 0.350–4.500)

## 2016-10-19 MED ORDER — METOPROLOL TARTRATE 25 MG PO TABS
50.0000 mg | ORAL_TABLET | Freq: Once | ORAL | Status: AC
  Administered 2016-10-19: 50 mg via ORAL
  Filled 2016-10-19: qty 2

## 2016-10-19 NOTE — ED Notes (Signed)
Pt stable, states understanding of discharge instructions, family at bedside 

## 2016-10-20 ENCOUNTER — Other Ambulatory Visit: Payer: Self-pay | Admitting: Internal Medicine

## 2016-10-20 DIAGNOSIS — I10 Essential (primary) hypertension: Secondary | ICD-10-CM

## 2016-10-21 LAB — URINE CULTURE: Culture: >=100000 — AB

## 2016-10-22 ENCOUNTER — Telehealth: Payer: Self-pay | Admitting: *Deleted

## 2016-10-22 ENCOUNTER — Encounter: Payer: Self-pay | Admitting: Nurse Practitioner

## 2016-10-22 ENCOUNTER — Ambulatory Visit (INDEPENDENT_AMBULATORY_CARE_PROVIDER_SITE_OTHER): Payer: Medicare Other | Admitting: Nurse Practitioner

## 2016-10-22 VITALS — BP 134/82 | HR 68 | Temp 97.8°F | Resp 19 | Ht 59.0 in | Wt 140.8 lb

## 2016-10-22 DIAGNOSIS — I481 Persistent atrial fibrillation: Secondary | ICD-10-CM | POA: Diagnosis not present

## 2016-10-22 DIAGNOSIS — N182 Chronic kidney disease, stage 2 (mild): Secondary | ICD-10-CM

## 2016-10-22 DIAGNOSIS — Z7901 Long term (current) use of anticoagulants: Secondary | ICD-10-CM

## 2016-10-22 DIAGNOSIS — I4819 Other persistent atrial fibrillation: Secondary | ICD-10-CM

## 2016-10-22 DIAGNOSIS — F411 Generalized anxiety disorder: Secondary | ICD-10-CM

## 2016-10-22 DIAGNOSIS — I10 Essential (primary) hypertension: Secondary | ICD-10-CM | POA: Diagnosis not present

## 2016-10-22 MED ORDER — WARFARIN SODIUM 5 MG PO TABS: 5.0000 mg | ORAL_TABLET | Freq: Every day | ORAL | 0 refills | Status: DC

## 2016-10-22 NOTE — Progress Notes (Signed)
Careteam: Patient Care Team: Diana Dooms, MD as PCP - General (Internal Medicine) Diana Maudlin, MD as Consulting Physician (Orthopedic Surgery) Diana Blade, MD as Attending Physician (General Surgery) Diana Guys, MD as Consulting Physician (Ophthalmology)  Advanced Directive information Does Patient Have a Medical Advance Directive?: Yes, Type of Advance Directive: Healthcare Power of Attorney  Allergies  Allergen Reactions  . Macrobid [Nitrofurantoin Macrocrystal]     Chief Complaint  Patient presents with  . Follow-up    Pt is being seen for a ED follow up. Pt was seen at [facility] ED on 10/18/16 due to A-fib after a fall.   . Other    Daughter in law and Granddaughter in room      HPI: Patient is a 83 y.o. female seen in the office today to follow up ED visit on 10/18/16. Pt with hx of CKD and HTN who was brought to ED due to fall and found to be in a fib. Felt like she was going to pass out in the elevator and then when she got off she fell back and hit her buttocks. Just did not feel right. There was no pain or injury. Pt had no hx of a fib. HR was elevated in the ED.  She was given IV Cardizem and started on metoprolol PO 50 mg daily.  Feels good today, not dizzy or lightheaded. Has been doing well since the day after her fall.  Reports she does not "hardly ever" fall. Family reports this is the only fall that she is aware of.  CHADSVASC SCORE = 4  She was not placed on anticoagulation and it was left to her primary care to discuss with pt and decide.   Review of Systems:  Review of Systems  Constitutional: Positive for malaise/fatigue. Negative for chills and fever.  HENT: Positive for hearing loss. Negative for congestion.   Eyes:       Glasses  Respiratory: Negative for cough and shortness of breath.   Cardiovascular: Negative for chest pain and palpitations.  Gastrointestinal: Negative for abdominal pain.  Genitourinary: Negative for dysuria.    Musculoskeletal: Positive for joint pain. Negative for falls.       Unsteady gait, walks with rollator  Neurological: Negative for dizziness, sensory change, focal weakness, weakness and headaches.  Psychiatric/Behavioral: Positive for memory loss.    Past Medical History:  Diagnosis Date  . Anxiety state, unspecified   . Dysthymic disorder   . First degree atrioventricular block   . Hyperglycemia 01/30/09  . Malignant neoplasm of breast (female), unspecified site   . Osteoarthrosis, unspecified whether generalized or localized, unspecified site   . Senile osteoporosis   . Trigger finger (acquired)   . Unspecified constipation   . Unspecified essential hypertension   . Unspecified hypothyroidism   . Unspecified vitamin D deficiency    Past Surgical History:  Procedure Laterality Date  . CATARACT EXTRACTION BILATERAL W/ ANTERIOR VITRECTOMY Bilateral 2002  . MASTECTOMY Left 1986   Dr.Weatherly   . ORIF FACIAL FRACTURE  1962   Social History:   reports that she has never smoked. She has never used smokeless tobacco. She reports that she does not drink alcohol or use drugs.  Family History  Problem Relation Age of Onset  . Cerebrovascular Accident Mother   . Hypertension Mother   . Cerebrovascular Accident Father   . Heart disease Sister   . Emphysema Brother     Medications: Patient's Medications  New Prescriptions  No medications on file  Previous Medications   AMLODIPINE-BENAZEPRIL (LOTREL) 5-10 MG CAPSULE    TAKE 1 CAPSULE BY MOUTH DAILY FOR BLOOD PRESSURE   CHLORDIAZEPOXIDE (LIBRIUM) 10 MG CAPSULE    Take one capsule daily as needed for rest or nerves   HYDROCHLOROTHIAZIDE (HYDRODIURIL) 25 MG TABLET    TAKE 1 TABLET BY MOUTH DAILY FOR BLOOD PRESSURE   LEVOTHYROXINE (SYNTHROID, LEVOTHROID) 88 MCG TABLET    TAKE 1 TABLET BY MOUTH DAILY FOR THYROID SUPPLEMENT   METOPROLOL SUCCINATE (TOPROL-XL) 50 MG 24 HR TABLET    TAKE 1 TABLET BY MOUTH DAILY TO CONTROL BLOOD  PRESSURE AND HEART RHYTHM.(TAKE WITH OR IMMEDIATELY FOLLOWING A MEAL)  Modified Medications   No medications on file  Discontinued Medications   No medications on file     Physical Exam:  Vitals:   10/22/16 0854  BP: 134/82  Pulse: 68  Resp: 19  Temp: 97.8 F (36.6 C)  TempSrc: Oral  SpO2: 97%  Weight: 140 lb 12.8 oz (63.9 kg)  Height: 4\' 11"  (1.499 m)   Body mass index is 28.44 kg/m.  Physical Exam  Constitutional: She is oriented to person, place, and time. She appears well-nourished. No distress.  Frail.  HENT:  Head: Normocephalic and atraumatic.  Right Ear: External ear normal.  Nose: Nose normal.  Eyes:  Corrective lenses.  Neck: Neck supple. No JVD present. No tracheal deviation present. No thyromegaly present.  Cardiovascular: Normal rate, normal heart sounds and intact distal pulses.  An irregular rhythm present. Exam reveals no gallop and no friction rub.   No murmur heard. Pulmonary/Chest: Effort normal and breath sounds normal. No respiratory distress. She has no wheezes. She has no rales. She exhibits no tenderness.  Left mastectomy  Abdominal: Bowel sounds are normal. She exhibits no distension and no mass. There is no tenderness.  Musculoskeletal: Normal range of motion. She exhibits no edema or tenderness.  Unstable gait, Using four-wheel walker with brakes and seat. Only 1 fall noted  Lymphadenopathy:    She has no cervical adenopathy.  Neurological: She is alert and oriented to person, place, and time. No cranial nerve deficit. Coordination normal.  01/31/15 MMSE 24/30. Impaired clock drawing. 02/19/16 MMSE 28/30. Impaired clock drawing.  Skin: No rash noted. No erythema. No pallor.  Psychiatric: She has a normal mood and affect. Her behavior is normal. Thought content normal.    Labs reviewed: Basic Metabolic Panel:  Recent Labs  02/19/16 1025 08/14/16 0913 10/18/16 2247 10/19/16 0020  NA 139 140 137  --   K 4.0 3.9 2.9*  --   CL 103 104  102  --   CO2 26 27 24   --   GLUCOSE 89 92 173*  --   BUN 21 24 23*  --   CREATININE 1.68* 1.65* 1.58*  --   CALCIUM 9.1 9.3 8.8*  --   TSH 7.44* 4.48  --  5.963*   Estimated Creatinine Clearance: 16.5 mL/min (A) (by C-G formula based on SCr of 1.58 mg/dL (H)).  Liver Function Tests:  Recent Labs  02/19/16 1025  AST 20  ALT 7  ALKPHOS 72  BILITOT 0.6  PROT 6.4  ALBUMIN 3.7   No results for input(s): LIPASE, AMYLASE in the last 8760 hours. No results for input(s): AMMONIA in the last 8760 hours. CBC:  Recent Labs  10/18/16 2247  WBC 6.5  NEUTROABS 3.8  HGB 11.5*  HCT 35.1*  MCV 95.6  PLT 183   Lipid  Panel:  Recent Labs  02/19/16 1025  CHOL 172  HDL 53  LDLCALC 93  TRIG 132  CHOLHDL 3.2   TSH:  Recent Labs  02/19/16 1025 08/14/16 0913 10/19/16 0020  TSH 7.44* 4.48 5.963*   A1C: Lab Results  Component Value Date   HGBA1C 5.6 08/14/2016     Assessment/Plan 1. Persistent atrial fibrillation (HCC) -now rate controlled on lopressor 50 mg daily. ED deferred anticoagulation to PCP. Discussed risk vs benefit of anticoagulation with pt, daughter in law and daughter.  -due to CrCl she is not candidate for NOAC -pt and family requested Dr Hervey Ard input, discussed with Dr Nyoka Cowden and will start on coumadin 5 mg daily. Age, mobility and comorbities considered. Pt has great QOL at this time, very mobile with only 1 fall "ever" per family and pt. Some recent memory decline but still very active and involved in activities and with family.  - warfarin (COUMADIN) 5 MG tablet; Take 1 tablet (5 mg total) by mouth daily.  Dispense: 30 tablet; Refill: 0  2. Chronic kidney disease (CKD) stage G2/A2, mildly decreased glomerular filtration rate (GFR) between 60-89 mL/min/1.73 square meter and albuminuria creatinine ratio between 30-299 mg/g Estimated Creatinine Clearance: 16.5 mL/min (A) (by C-G formula based on SCr of 1.58 mg/dL (H)). -will require coumadin due to  CrCL  3. Long term (current) use of anticoagulants - warfarin (COUMADIN) 5 MG tablet; Take 1 tablet (5 mg total) by mouth daily.  Dispense: 30 tablet; Refill: 0  4. Essential hypertension Blood pressure stable on current regimen.   FOLLOW UP in 1 week for INR Dorothymae Maciver K. Harle Battiest  [facility] [phone] 8 am - 5 pm) [phone] (after hours)

## 2016-10-22 NOTE — Patient Instructions (Addendum)
Start coumadin (warfarin) 5 mg daily for anticoagulation (to prevent stoke from clot due to atrial fibrillation  Notify for excessive bruising or any bleeding (nose, urine, stool, skin)  Prothrombin Time, International Normalized Ratio Test Why am I having this test? A prothrombin time (Pro-Time, PT) test measures how many seconds it takes your blood to clot. The international normalized ratio (INR) is a calculation of blood clotting time based on your PT result. Most labs report both PT and INR values when reporting blood clotting times. Your health care provider may want you to have this test done if:  You have certain medical conditions that cause abnormal bleeding or blood clotting. These can include: ? Liver disease. ? Systemic infection (sepsis). ? Inherited (genetic) bleeding disorders.  You are taking a medicine to prevent excessive blood clotting (anticoagulant), such as warfarin. ? If you are taking warfarin, you will likely be asked to have this test done at regular intervals. The results of this test will help your health care provider determine what dose of warfarin you need based on how quickly or slowly your blood clots. It is very important to have this test done as often as your health care provider recommends.  What kind of sample is taken? A blood sample is required for this test. It is usually collected by inserting a needle into a vein or by sticking a finger with a small needle. How do I prepare for this test? There is no preparation required for this test. What are the reference intervals? Reference intervalsare considered healthy intervalsestablished after testing a large group of healthy people. Reference intervals may vary among different people, labs, and hospitals. It is your responsibility to obtain your test results. Ask the lab or department performing the test when and how you will get your results. Reference intervals for this test are as follows:  Without  anticoagulant treatment (control value): 11.0-12.5 seconds; 85-100%.  With full anticoagulant treatment: greater than 1.5-2 times the control value; 20-30%.  INR: 0.8-1.1.  What do the results mean? There are several factors that can alter your PT and INR test results. It is important for you to know that:  PT and INR results can be affected by certain foods you eat, especially foods that contain moderate or high amounts of vitamin K. It is important to eat a consistent amount of vitamin K-rich food. Let your health care provider know if you have recently changed your diet.  PT and INR results can be affected by some medicines. Do not stop, add, or change any medicines without letting your health care provider know.  Talk with your health care provider to discuss your results, treatment options, and if necessary, the need for more tests. Talk with your health care provider if you have any questions about your results. Talk with your health care provider to discuss your results, treatment options, and if necessary, the need for more tests. Talk with your health care provider if you have any questions about your results. This information is not intended to replace advice given to you by your health care provider. Make sure you discuss any questions you have with your health care provider. Document Released: 06/08/2004 Document Revised: 01/10/2016 Document Reviewed: 09/29/2013 Elsevier Interactive Patient Education  2017 [employer] and Warfarin Warfarin is a blood thinner (anticoagulant). Anticoagulant medicines help prevent the formation of blood clots. These medicines work by decreasing the activity of vitamin K, which promotes normal blood clotting. When you take warfarin,  problems can occur from suddenly increasing or decreasing the amount of vitamin K that you eat from one day to the next. Problems may include:  Blood clots.  Bleeding.  What general guidelines do I  need to follow? To avoid problems when taking warfarin:  Eat a balanced diet that includes: ? Fresh fruits and vegetables. ? Whole grains. ? Low-fat dairy products. ? Lean proteins, such as fish, eggs, and lean cuts of meat.  Keep your intake of vitamin K consistent from day to day. To do this: ? Avoid eating large amounts of vitamin K one day and low amounts of vitamin K the next day. ? If you take a multivitamin that contains vitamin K, be sure to take it every day. ? Know which foods contain vitamin K. Use the lists below to understand serving sizes and the amount of vitamin K in one serving.  Avoid major changes in your diet. If you are going to change your diet, talk with your health care provider before making changes.  Work with a Financial planner (dietitian) to develop a meal plan that works best for you.  High vitamin K foods Foods that are high in vitamin K contain more than 100 mcg (micrograms) per serving. These include:  Broccoli (cooked) -  cup has 110 mcg.  Brussels sprouts (cooked) -  cup has 109 mcg.  Greens, beet (cooked) -  cup has 350 mcg.  Greens, collard (cooked) -  cup has 418 mcg.  Greens, turnip (cooked) -  cup has 265 mcg.  Green onions or scallions -  cup has 105 mcg.  Kale (fresh or frozen) -  cup has 531 mcg.  Parsley (raw) - 10 sprigs has 164 mcg.  Spinach (cooked) -  cup has 444 mcg.  Swiss chard (cooked) -  cup has 287 mcg.  Moderate vitamin K foods Foods that have a moderate amount of vitamin K contain 25-100 mcg per serving. These include:  Asparagus (cooked) - 5 spears have 38 mcg.  Black-eyed peas (dried) -  cup has 32 mcg.  Cabbage (cooked) -  cup has 37 mcg.  Kiwi fruit - 1 medium has 31 mcg.  Lettuce - 1 cup has 57-63 mcg.  Okra (frozen) -  cup has 44 mcg.  Prunes (dried) - 5 prunes have 25 mcg.  Watercress (raw) - 1 cup has 85 mcg.  Low vitamin K foods Foods low in vitamin K contain less than 25 mcg  per serving. These include:  Artichoke - 1 medium has 18 mcg.  Avocado - 1 oz. has 6 mcg.  Blueberries -  cup has 14 mcg.  Cabbage (raw) -  cup has 21 mcg.  Carrots (cooked) -  cup has 11 mcg.  Cauliflower (raw) -  cup has 11 mcg.  Cucumber with peel (raw) -  cup has 9 mcg.  Grapes -  cup has 12 mcg.  Mango - 1 medium has 9 mcg.  Nuts - 1 oz. has 15 mcg.  Pear - 1 medium has 8 mcg.  Peas (cooked) -  cup has 19 mcg.  Pickles - 1 spear has 14 mcg.  Pumpkin seeds - 1 oz. has 13 mcg.  Sauerkraut (canned) -  cup has 16 mcg.  Soybeans (cooked) -  cup has 16 mcg.  Tomato (raw) - 1 medium has 10 mcg.  Tomato sauce -  cup has 17 mcg.  Vitamin K-free foods If a food contain less than 5 mcg per serving, it is considered  to have no vitamin K. These foods include:  Bread and cereal products.  Cheese.  Eggs.  Fish and shellfish.  Meat and poultry.  Milk and dairy products.  Sunflower seeds.  Actual amounts of vitamin K in foods may be different depending on processing. Talk with your dietitian about what foods you can eat and what foods you should avoid. This information is not intended to replace advice given to you by your health care provider. Make sure you discuss any questions you have with your health care provider. Document Released: 03/03/2009 Document Revised: 11/26/2015 Document Reviewed: 08/09/2015 Elsevier Interactive Patient Education  2017 [employer]. Bleeding Precautions When on Anticoagulant Therapy WHAT IS ANTICOAGULANT THERAPY? Anticoagulant therapy is taking medicine to prevent or reduce blood clots. It is also called blood thinner therapy. Blood clots that form in your blood vessels can be dangerous. They can break loose and travel to your heart, lungs, or brain. This increases your risk of a heart attack or stroke. Anticoagulant therapy causes blood to clot more slowly. You may need anticoagulant therapy if you have:  A medical  condition that increases the likelihood that blood clots will form.  A heart defect or a problem with heart rhythm. It is also a common treatment after heart surgery, such as valve replacement. WHAT ARE COMMON TYPES OF ANTICOAGULANT THERAPY? Anticoagulant medicine can be injected or taken by mouth.If you need anticoagulant therapy quickly at the hospital, the medicine may be injected under your skin or given through an IV tube. Heparin is a common example of an anticoagulant that you may get at the hospital. Most anticoagulant therapy is in the form of pills that you take at home every day. These may include:  Aspirin. This common blood thinner works by preventing blood cells (platelets) from sticking together to form a clot. Aspirin is not as strong as anticoagulants that slow down the time that it takes for your body to form a clot.  Clopidogrel. This is a newer type of drug that affects platelets. It is stronger than aspirin.  Warfarin. This is the most common anticoagulant. It changes the way your body uses vitamin K, a vitamin that helps your blood to clot. The risk of bleeding is higher with warfarin than with aspirin. You will need frequent blood tests to make sure you are taking the safest amount.  New anticoagulants. Several new drugs have been approved. They are all taken by mouth. Studies show that these drugs work as well as warfarin. They do not require blood testing. They may cause less bleeding risk than warfarin. WHAT DO I NEED TO REMEMBER WHEN TAKING ANTICOAGULANT THERAPY? Anticoagulant therapy decreases your risk of forming a blood clot, but it increases your risk of bleeding. Work closely with your health care provider to make sure you are taking your medicine safely. These tips can help:  Learn ways to reduce your risk of bleeding.  If you are taking warfarin: ? Have blood tests as ordered by your health care provider. ? Do not make any sudden changes to your diet. Vitamin  K in your diet can make warfarin less effective. ? Do not get pregnant. This medicine may cause birth defects.  Take your medicine at the same time every day. If you forget to take your medicine, take it as soon as you remember. If you miss a whole day, do not double your dose of medicine. Take your normal dose and call your health care provider to check in.  Do  not stop taking your medicine on your own.  Tell your health care provider before you start taking any new medicine, vitamin, or herbal product. Some of these could interfere with your therapy.  Tell all of your health care providers that you are on anticoagulant therapy.  Do not have surgery, medical procedures, or dental work until you tell your health care provider that you are on anticoagulant therapy. WHAT CAN AFFECT HOW ANTICOAGULANTS WORK? Certain foods, vitamins, medicines, supplements, and herbal medicines change the way that anticoagulant therapy works. They may increase or decrease the effects of your anticoagulant therapy. Either result can be dangerous for you.  Many over-the-counter medicines for pain, colds, or stomach problems interfere with anticoagulant therapy. Take these only as told by your health care provider.  Do not drink alcohol. It can interfere with your medicine and increase your risk of an injury that causes bleeding.  If you are taking warfarin, do not begin eating more foods that contain vitamin K. These include leafy green vegetables. Ask your health care provider if you should avoid any foods. WHAT ARE SOME WAYS TO PREVENT BLEEDING? You can prevent bleeding by taking certain precautions:  Be extra careful when you use knives, scissors, or other sharp objects.  Use an electric razor instead of a blade.  Do not use toothpicks.  Use a soft toothbrush.  Wear shoes that have nonskid soles.  Use bath mats and handrails in your bathroom.  Wear gloves while you do yard work.  Wear a helmet when  you ride a bike.  Wear your seat belt.  Prevent falls by removing loose rugs and extension cords from areas where you walk.  Do not play contact sports or participate in other activities that have a high risk of injury. [place] PROVIDER? Call your health care provider if:  You miss a dose of medicine: ? And you are not sure what to do. ? For more than one day.  You have: ? Menstrual bleeding that is heavier than normal. ? Blood in your urine. ? A bloody nose or bleeding gums. ? Easy bruising. ? Blood in your stool (feces) or have black and tarry stool. ? Side effects from your medicine.  You feel weak or dizzy.  You become pregnant. Seek immediate medical care if:  You have bleeding that will not stop.  You have sudden and severe headache or belly pain.  You vomit or you cough up bright red blood.  You have a severe blow to your head. WHAT ARE SOME QUESTIONS TO ASK MY HEALTH CARE PROVIDER?  What is the best anticoagulant therapy for my condition?  What side effects should I watch for?  When should I take my medicine? What should I do if I forget to take it?  Will I need to have regular blood tests?  Do I need to change my diet? Are there foods or drinks that I should avoid?  What activities are safe for me?  What should I do if I want to get pregnant? This information is not intended to replace advice given to you by your health care provider. Make sure you discuss any questions you have with your health care provider. Document Released: 04/17/2015 Document Reviewed: 04/17/2015 Elsevier Interactive Patient Education  2017 [employer].

## 2016-10-22 NOTE — Telephone Encounter (Signed)
Post ED Visit - Positive Culture Follow-up: Successful Patient Follow-Up  Culture assessed and recommendations reviewed by: [x]  Elenor Quinones, Pharm.D. []  Heide Guile, Pharm.D., BCPS AQ-ID []  Parks Neptune, Pharm.D., BCPS []  Alycia Rossetti, Pharm.D., BCPS []  [place], [place].D., BCPS, AAHIVP []  Legrand Como, Pharm.D., BCPS, AAHIVP []  Salome Arnt, PharmD, BCPS []  Dimitri Ped, PharmD, BCPS []  Vincenza Hews, PharmD, BCPS  Positive urine culture  [x]  Patient discharged without antimicrobial prescription and treatment is now indicated if patient is symptomatic.  Patient states she does not have any symptoms and advised family member to call back to patient placement RN if symptoms develop and prescription can be called in.  Patient to see PCP on 10/28/2016 and family will have MD to reassess at that time. []  Organism is resistant to prescribed ED discharge antimicrobial []  Patient with positive blood cultures  Changes discussed with ED provider:Jordan Virgina Jock, PA-C New antibiotic prescription Cephalexin 250mg  PO BID x 5 days recommended if symptomatic Called to:  Asymptomatic at time of contact  Contacted patient, date 10/22/2016, time Loraine 10/22/2016, 10:43 AM

## 2016-10-22 NOTE — Progress Notes (Signed)
ED Antimicrobial Stewardship Positive Culture Follow Up   Diana Anthony is an 83 y.o. female who presented to [facility] on 10/18/2016 with a chief complaint of  Chief Complaint  Patient presents with  . Atrial Fibrillation    Recent Results (from the past 720 hour(s))  Urine culture     Status: Abnormal   Collection Time: 10/19/16 12:28 AM  Result Value Ref Range Status   Specimen Description URINE, CLEAN CATCH  Final   Special Requests NONE  Final   Culture >=100,000 COLONIES/mL ESCHERICHIA COLI (A)  Final   Report Status 10/21/2016 FINAL  Final   Organism ID, Bacteria ESCHERICHIA COLI (A)  Final      Susceptibility   Escherichia coli - MIC*    AMPICILLIN <=2 SENSITIVE Sensitive     CEFAZOLIN <=4 SENSITIVE Sensitive     CEFTRIAXONE <=1 SENSITIVE Sensitive     CIPROFLOXACIN <=0.25 SENSITIVE Sensitive     GENTAMICIN <=1 SENSITIVE Sensitive     IMIPENEM <=0.25 SENSITIVE Sensitive     NITROFURANTOIN <=16 SENSITIVE Sensitive     TRIMETH/SULFA <=20 SENSITIVE Sensitive     AMPICILLIN/SULBACTAM <=2 SENSITIVE Sensitive     PIP/TAZO <=4 SENSITIVE Sensitive     Extended ESBL NEGATIVE Sensitive     * >=100,000 COLONIES/mL ESCHERICHIA COLI    No symptoms in ED so probable asymptomatic bacteruria. Flow manager to call and see if patient has symptoms.  If symptomatic now, prescribe cephalexin 250mg  PO BID x 5 days  ED Provider: Martinique Russon PA-C   Reginia Naas 10/22/2016, 9:27 AM Infectious Diseases Pharmacist Phone# [phone]

## 2016-10-28 ENCOUNTER — Ambulatory Visit (INDEPENDENT_AMBULATORY_CARE_PROVIDER_SITE_OTHER): Payer: Medicare Other | Admitting: Nurse Practitioner

## 2016-10-28 ENCOUNTER — Encounter: Payer: Self-pay | Admitting: Nurse Practitioner

## 2016-10-28 VITALS — BP 142/78 | HR 68 | Temp 97.7°F | Resp 17 | Ht 59.0 in | Wt 139.6 lb

## 2016-10-28 DIAGNOSIS — R05 Cough: Secondary | ICD-10-CM | POA: Diagnosis not present

## 2016-10-28 DIAGNOSIS — R059 Cough, unspecified: Secondary | ICD-10-CM

## 2016-10-28 DIAGNOSIS — I4819 Other persistent atrial fibrillation: Secondary | ICD-10-CM

## 2016-10-28 DIAGNOSIS — I481 Persistent atrial fibrillation: Secondary | ICD-10-CM

## 2016-10-28 DIAGNOSIS — I10 Essential (primary) hypertension: Secondary | ICD-10-CM

## 2016-10-28 DIAGNOSIS — N182 Chronic kidney disease, stage 2 (mild): Secondary | ICD-10-CM

## 2016-10-28 MED ORDER — ASPIRIN EC 81 MG PO TBEC: 81.0000 mg | DELAYED_RELEASE_TABLET | Freq: Every day | ORAL | Status: AC

## 2016-10-28 NOTE — Patient Instructions (Addendum)
STOP hydrochlorothiazide  START EC ASA 81 mg daily  Encourage hydration  Take blood pressure and HR at least 1 hour after she has taken her medication and she has been sitting down for at least 5 mins.  Call office after 2 weeks.    mucinex DM (cough and congestion) or delsym (cough) by mouth twice daily

## 2016-10-28 NOTE — Progress Notes (Signed)
Careteam: Patient Care Team: [name], MD as PCP - General (Internal Medicine) [name], MD as Consulting Physician (Orthopedic Surgery) [name], MD as Attending Physician (General Surgery) [name], MD as Consulting Physician (Ophthalmology)  Advanced Directive information Does Patient Have a Medical Advance Directive?: Yes, Type of Advance Directive: Healthcare Power of Attorney  Allergies  Allergen Reactions  . Macrobid [Nitrofurantoin Macrocrystal]     Chief Complaint  Patient presents with  . Follow-up    Pt is being seen for an INR recheck but patient and family have decided that patient will not take coumadin due to side effects and her age. Pt has only taken 1 tablet.  . Other    Son and daughter in law in room      HPI: Patient is a 83 y.o. female seen in the office today to follow up INR however after research would not like to take coumadin due to side effects.  CHADSVASC SCORE = 4 son and daughter in law with her today.  Lots of fears of bleeding, pt does live alone in a retirement community.  Does not wish to have INR checks done.  Family did research after last visit.  No light headedness, falls or dizziness since last visit. No LE edema Does not like to drink water. Review of Systems:  Review of Systems  Constitutional: Negative for chills, fever and malaise/fatigue.  HENT: Positive for hearing loss. Negative for congestion.   Eyes:       Glasses  Respiratory: Positive for cough. Negative for shortness of breath.   Cardiovascular: Negative for chest pain and palpitations.  Gastrointestinal: Negative for abdominal pain.  Genitourinary: Negative for dysuria.  Musculoskeletal: Positive for joint pain. Negative for falls.       Unsteady gait, walks with rollator  Neurological: Negative for dizziness, sensory change, focal weakness, weakness and headaches.  Psychiatric/Behavioral: Positive for memory loss.    Past  Medical History:  Diagnosis Date  . Anxiety state, unspecified   . Dysthymic disorder   . First degree atrioventricular block   . Hyperglycemia 01/30/09  . Malignant neoplasm of breast (female), unspecified site   . Osteoarthrosis, unspecified whether generalized or localized, unspecified site   . Senile osteoporosis   . Trigger finger (acquired)   . Unspecified constipation   . Unspecified essential hypertension   . Unspecified hypothyroidism   . Unspecified vitamin D deficiency    Past Surgical History:  Procedure Laterality Date  . CATARACT EXTRACTION BILATERAL W/ ANTERIOR VITRECTOMY Bilateral 2002  . MASTECTOMY Left 1986   Dr.Weatherly   . ORIF FACIAL FRACTURE  1962   Social History:   reports that she has never smoked. She has never used smokeless tobacco. She reports that she does not drink alcohol or use drugs.  Family History  Problem Relation Age of Onset  . Cerebrovascular Accident Mother   . Hypertension Mother   . Cerebrovascular Accident Father   . Heart disease Sister   . Emphysema Brother     Medications: Patient's Medications  New Prescriptions   No medications on file  Previous Medications   AMLODIPINE-BENAZEPRIL (LOTREL) 5-10 MG CAPSULE    TAKE 1 CAPSULE BY MOUTH DAILY FOR BLOOD PRESSURE   CHLORDIAZEPOXIDE (LIBRIUM) 10 MG CAPSULE    Take one capsule daily as needed for rest or nerves   HYDROCHLOROTHIAZIDE (HYDRODIURIL) 25 MG TABLET    TAKE 1 TABLET BY MOUTH DAILY FOR BLOOD PRESSURE   LEVOTHYROXINE (SYNTHROID, LEVOTHROID) 88  MCG TABLET    TAKE 1 TABLET BY MOUTH DAILY FOR THYROID SUPPLEMENT   METOPROLOL SUCCINATE (TOPROL-XL) 50 MG 24 HR TABLET    TAKE 1 TABLET BY MOUTH DAILY TO CONTROL BLOOD PRESSURE AND HEART RHYTHM.(TAKE WITH OR IMMEDIATELY FOLLOWING A MEAL)   WARFARIN (COUMADIN) 5 MG TABLET    Take 1 tablet (5 mg total) by mouth daily.  Modified Medications   No medications on file  Discontinued Medications   No medications on file     Physical  Exam:  Vitals:   10/28/16 1038  BP: (!) 142/78  Pulse: 68  Resp: 17  Temp: 97.7 F (36.5 C)  TempSrc: Oral  SpO2: 96%  Weight: 139 lb 9.6 oz (63.3 kg)  Height: 4\' 11"  (1.499 m)   Body mass index is 28.2 kg/m.  Physical Exam  Constitutional: She appears well-nourished. No distress.  Frail.  HENT:  Head: Normocephalic and atraumatic.  Right Ear: External ear normal.  Nose: Nose normal.  Eyes:  Corrective lenses.  Neck: Neck supple. No JVD present. No tracheal deviation present. No thyromegaly present.  Cardiovascular: Normal rate, normal heart sounds and intact distal pulses.  An irregular rhythm present. Exam reveals no gallop and no friction rub.   No murmur heard. Pulmonary/Chest: Effort normal and breath sounds normal. No respiratory distress. She has no wheezes. She has no rales. She exhibits no tenderness.  Left mastectomy  Abdominal: Bowel sounds are normal. She exhibits no distension and no mass. There is no tenderness.  Musculoskeletal: Normal range of motion. She exhibits no edema or tenderness.  Unstable gait, Using four-wheel walker with brakes and seat. Only 1 fall noted  Lymphadenopathy:    She has no cervical adenopathy.  Neurological: She is alert. No cranial nerve deficit. Coordination normal.  01/31/15 MMSE 24/30. Impaired clock drawing. 02/19/16 MMSE 28/30. Impaired clock drawing.  Skin: No rash noted. No erythema. No pallor.  Psychiatric: She has a normal mood and affect. Her behavior is normal. Thought content normal.    Labs reviewed: Basic Metabolic Panel:  Recent Labs  02/19/16 1025 08/14/16 0913 10/18/16 2247 10/19/16 0020  NA 139 140 137  --   K 4.0 3.9 2.9*  --   CL 103 104 102  --   CO2 26 27 24   --   GLUCOSE 89 92 173*  --   BUN 21 24 23*  --   CREATININE 1.68* 1.65* 1.58*  --   CALCIUM 9.1 9.3 8.8*  --   TSH 7.44* 4.48  --  5.963*   Liver Function Tests:  Recent Labs  02/19/16 1025  AST 20  ALT 7  ALKPHOS 72  BILITOT 0.6   PROT 6.4  ALBUMIN 3.7   No results for input(s): LIPASE, AMYLASE in the last 8760 hours. No results for input(s): AMMONIA in the last 8760 hours. CBC:  Recent Labs  10/18/16 2247  WBC 6.5  NEUTROABS 3.8  HGB 11.5*  HCT 35.1*  MCV 95.6  PLT 183   Lipid Panel:  Recent Labs  02/19/16 1025  CHOL 172  HDL 53  LDLCALC 93  TRIG 132  CHOLHDL 3.2   TSH:  Recent Labs  02/19/16 1025 08/14/16 0913 10/19/16 0020  TSH 7.44* 4.48 5.963*   A1C: Lab Results  Component Value Date   HGBA1C 5.6 08/14/2016     Assessment/Plan 1. Persistent atrial fibrillation (HCC) -rate controlled. Family would not like to continue coumadin after reviewing risk vs benefits.  Will add ASA 81  mg daily at this time - aspirin EC 81 MG tablet; Take 1 tablet (81 mg total) by mouth daily.  2. Chronic kidney disease (CKD) stage G2/A2, mildly decreased glomerular filtration rate (GFR) between 60-89 mL/min/1.73 square meter and albuminuria creatinine ratio between 30-299 mg/g To avoid nephrotoxic medications, encouraged proper hydration.   3. Essential hypertension Will stop HCTZ, pt is not drinking much fluids  -to take BP and HR 3 times weekly and call office with readings after 2 weeks.   4. Cough Son and daughter-in-law both with viral infection, pt started to cough last night but otherwise without symptoms. Discussed OTC she could use  Eulogio Requena K. Harle Battiest  [facility] [phone] 8 am - 5 pm) [phone] (after hours)

## 2016-11-04 ENCOUNTER — Telehealth: Payer: Self-pay | Admitting: *Deleted

## 2016-11-04 DIAGNOSIS — I10 Essential (primary) hypertension: Secondary | ICD-10-CM

## 2016-11-04 MED ORDER — AMLODIPINE BESY-BENAZEPRIL HCL 10-20 MG PO CAPS: 1.0000 | ORAL_CAPSULE | Freq: Every day | ORAL | 0 refills | Status: DC

## 2016-11-04 NOTE — Telephone Encounter (Signed)
Patient daughter, Nicole Kindred notified and agreed. Will bring BP log to appointment scheduled for 11/18/16.

## 2016-11-04 NOTE — Telephone Encounter (Signed)
Patient daughter, Diana Anthony called and stated that Diana Anthony told them to call back with BP Readings:   10/30/16- 133/76  Pulse 62 10/31/16- 150/72  Pulse 61 11/02/16- 4:00pm 189/89, Pulse 62, 5:00pm 191/87 Pulse 67, 8:00pm 191/89 Pulse 65,    9:00pm 162/78 Pulse 65 11/03/16- 3:30pm 196/97 pulse 69, 4:30pm 189/81 pulse 65,  11/04/16- 188/82 pulse 69  Took off of one of her BP medication and was told to call in 2 weeks with BP readings but they felt the BP was running so high, they did not want to wait. Please Advise.

## 2016-11-04 NOTE — Telephone Encounter (Signed)
amLODipine-benazepril (LOTREL) 10-20 MG capsule has been increased and sent to the pharmacy. To take 2 tablets until she runs out of current medication  To have pt follow up in office in 2 weeks after medication change (we will review blood pressure log and get lab work- she can also get lab work prior to appt if she wants to check kidney function (BMP))

## 2016-11-11 ENCOUNTER — Telehealth: Payer: Self-pay | Admitting: *Deleted

## 2016-11-11 ENCOUNTER — Other Ambulatory Visit: Payer: Self-pay | Admitting: Nurse Practitioner

## 2016-11-11 MED ORDER — HYDRALAZINE HCL 10 MG PO TABS: 10.0000 mg | ORAL_TABLET | Freq: Three times a day (TID) | ORAL | 0 refills | Status: DC

## 2016-11-11 NOTE — Telephone Encounter (Signed)
Patient son, [name] called and stated that patient's BP has been running high. Stated that patient is taking medication as directed.   11/04/16-  173/83  Pulse 72 11/06/16-  134/72  Pulse 65 am, 181/84pm 11/08/16-  169/73  Pulse 65 am, 181/84pm 11/10/16-  165/82  Pulse 70 am, 174/80pm  Please Advise.

## 2016-11-11 NOTE — Telephone Encounter (Signed)
Son, Diana Anthony clarified that patient is taking both daily as directed.

## 2016-11-11 NOTE — Telephone Encounter (Signed)
So just to clarify she is taking the Lotrel 10-20 mg daily  and metoprolol 50 mg daily

## 2016-11-11 NOTE — Telephone Encounter (Signed)
My start hydralazine 10 mg by mouth three times daily for hypertension.

## 2016-11-11 NOTE — Telephone Encounter (Signed)
Patient's son notified and agreed. Jessica faxed Rx to pharmacy.

## 2016-11-15 NOTE — Addendum Note (Signed)
Addended by: Royann Shivers A on: 11/15/2016 03:22 PM   Modules accepted: Orders

## 2016-11-18 ENCOUNTER — Ambulatory Visit (INDEPENDENT_AMBULATORY_CARE_PROVIDER_SITE_OTHER): Payer: Medicare Other | Admitting: Nurse Practitioner

## 2016-11-18 ENCOUNTER — Encounter: Payer: Self-pay | Admitting: Nurse Practitioner

## 2016-11-18 ENCOUNTER — Other Ambulatory Visit: Payer: Self-pay | Admitting: Nurse Practitioner

## 2016-11-18 VITALS — BP 154/88 | HR 74 | Temp 97.8°F | Resp 17 | Ht 59.0 in | Wt 142.4 lb

## 2016-11-18 DIAGNOSIS — I10 Essential (primary) hypertension: Secondary | ICD-10-CM | POA: Diagnosis not present

## 2016-11-18 DIAGNOSIS — Z7901 Long term (current) use of anticoagulants: Secondary | ICD-10-CM

## 2016-11-18 DIAGNOSIS — I4819 Other persistent atrial fibrillation: Secondary | ICD-10-CM

## 2016-11-18 LAB — COMPLETE METABOLIC PANEL WITH GFR
ALBUMIN: 3.9 g/dL (ref 3.6–5.1)
ALK PHOS: 82 U/L (ref 33–130)
ALT: 8 U/L (ref 6–29)
AST: 19 U/L (ref 10–35)
BUN: 25 mg/dL (ref 7–25)
CO2: 21 mmol/L (ref 20–31)
Calcium: 9.3 mg/dL (ref 8.6–10.4)
Chloride: 105 mmol/L (ref 98–110)
Creat: 1.58 mg/dL — ABNORMAL HIGH (ref 0.60–0.88)
GFR, EST AFRICAN AMERICAN: 31 mL/min — AB (ref >=60)
GFR, EST NON AFRICAN AMERICAN: 27 mL/min — AB (ref >=60)
GLUCOSE: 102 mg/dL — AB (ref 65–99)
POTASSIUM: 4.3 mmol/L (ref 3.5–5.3)
SODIUM: 140 mmol/L (ref 135–146)
Total Bilirubin: 0.4 mg/dL (ref 0.2–1.2)
Total Protein: 7.1 g/dL (ref 6.1–8.1)

## 2016-11-18 MED ORDER — HYDRALAZINE HCL 25 MG PO TABS: 25.0000 mg | ORAL_TABLET | Freq: Three times a day (TID) | ORAL | 1 refills | Status: DC

## 2016-11-18 NOTE — Patient Instructions (Addendum)
Increase hydralazine to 25 mg by mouth three times daily Goal for blood pressure to be around 120-140/80

## 2016-11-18 NOTE — Progress Notes (Signed)
Careteam: Patient Care Team: Diana Curry, DO as PCP - General (Geriatric Medicine) [name], MD as Consulting Physician (Orthopedic Surgery) [name], MD as Attending Physician (General Surgery) [name], MD as Consulting Physician (Ophthalmology)  Advanced Directive information Does Patient Have a Medical Advance Directive?: Yes, Type of Advance Directive: Healthcare Power of Attorney  Allergies  Allergen Reactions  . Macrobid [Nitrofurantoin Macrocrystal]     Chief Complaint  Patient presents with  . Follow-up    Pt is being seen to follow up on blood pressure. Pt brought BP log for last several days.   . Other    Son and daughter in law in room      HPI: Patient is a 83 y.o. female seen in the office today to follow up blood pressure.  Pt was seen if office due a fib and it was noted that she does not drink much fluid therefore HCTZ was stopped. Pts blood pressure was elevated afterwards.  Amlodipine-benazepril was increased from 5-10 to 10-20 mg daily and hydralazine 10 mg was added TID.  No swelling to LE. Denies headaches, blurred vision. No chest pains. Overall doing well "for me"  Review of Systems:  Review of Systems  Constitutional: Negative for chills, fever and malaise/fatigue.  HENT: Positive for hearing loss. Negative for congestion.   Eyes:       Glasses  Respiratory: Negative for cough and shortness of breath.   Cardiovascular: Negative for chest pain and palpitations.  Gastrointestinal: Negative for abdominal pain.  Genitourinary: Negative for dysuria.  Musculoskeletal: Positive for joint pain. Negative for falls.       Unsteady gait, walks with rollator  Neurological: Negative for dizziness, sensory change, focal weakness, weakness and headaches.  Psychiatric/Behavioral: Positive for memory loss.    Past Medical History:  Diagnosis Date  . Anxiety state, unspecified   . Dysthymic disorder   . First degree  atrioventricular block   . Hyperglycemia 01/30/09  . Malignant neoplasm of breast (female), unspecified site   . Osteoarthrosis, unspecified whether generalized or localized, unspecified site   . Senile osteoporosis   . Trigger finger (acquired)   . Unspecified constipation   . Unspecified essential hypertension   . Unspecified hypothyroidism   . Unspecified vitamin D deficiency    Past Surgical History:  Procedure Laterality Date  . CATARACT EXTRACTION BILATERAL W/ ANTERIOR VITRECTOMY Bilateral 2002  . MASTECTOMY Left 1986   Dr.Weatherly   . ORIF FACIAL FRACTURE  1962   Social History:   reports that she has never smoked. She has never used smokeless tobacco. She reports that she does not drink alcohol or use drugs.  Family History  Problem Relation Age of Onset  . Cerebrovascular Accident Mother   . Hypertension Mother   . Cerebrovascular Accident Father   . Heart disease Sister   . Emphysema Brother     Medications: Patient's Medications  New Prescriptions   No medications on file  Previous Medications   AMLODIPINE-BENAZEPRIL (LOTREL) 10-20 MG CAPSULE    Take 1 capsule by mouth daily.   ASPIRIN EC 81 MG TABLET    Take 1 tablet (81 mg total) by mouth daily.   CHLORDIAZEPOXIDE (LIBRIUM) 10 MG CAPSULE    Take one capsule daily as needed for rest or nerves   HYDRALAZINE (APRESOLINE) 10 MG TABLET    Take 1 tablet (10 mg total) by mouth 3 (three) times daily.   LEVOTHYROXINE (SYNTHROID, LEVOTHROID) 88 MCG TABLET  TAKE 1 TABLET BY MOUTH DAILY FOR THYROID SUPPLEMENT   METOPROLOL SUCCINATE (TOPROL-XL) 50 MG 24 HR TABLET    TAKE 1 TABLET BY MOUTH DAILY TO CONTROL BLOOD PRESSURE AND HEART RHYTHM.(TAKE WITH OR IMMEDIATELY FOLLOWING A MEAL)  Modified Medications   No medications on file  Discontinued Medications   No medications on file     Physical Exam:  Vitals:   11/18/16 1415  BP: (!) 154/88  Pulse: 74  Resp: 17  Temp: 97.8 F (36.6 C)  TempSrc: Oral  SpO2: 96%    Weight: 142 lb 6.4 oz (64.6 kg)  Height: _0  (1.499 m)   Body mass index is 28.76 kg/m.  Physical Exam  Constitutional: She appears well-nourished. No distress.  Frail.  HENT:  Head: Normocephalic and atraumatic.  Right Ear: External ear normal.  Nose: Nose normal.  Eyes:  Corrective lenses.  Neck: Neck supple. No JVD present. No tracheal deviation present. No thyromegaly present.  Cardiovascular: Normal rate, regular rhythm and normal heart sounds.   Pulmonary/Chest: Effort normal and breath sounds normal. No respiratory distress. She has no wheezes. She has no rales. She exhibits no tenderness.  Left mastectomy  Abdominal: Bowel sounds are normal. She exhibits no distension and no mass. There is no tenderness.  Musculoskeletal: Normal range of motion. She exhibits no edema or tenderness.  Unstable gait, Using four-wheel walker with brakes and seat.  Lymphadenopathy:    She has no cervical adenopathy.  Neurological: She is alert. No cranial nerve deficit. Coordination normal.  01/31/15 MMSE 24/30. Impaired clock drawing. 02/19/16 MMSE 28/30. Impaired clock drawing.  Skin: No rash noted. No erythema. No pallor.  Psychiatric: She has a normal mood and affect. Her behavior is normal. Thought content normal.   Labs reviewed: Basic Metabolic Panel:  Recent Labs  02/19/16 1025 08/14/16 0913 10/18/16 2247 10/19/16 0020  NA 139 140 137  --   K 4.0 3.9 2.9*  --   CL 103 104 102  --   CO2 _1 --   GLUCOSE 89 92 173*  --   BUN 21 24 23*  --   CREATININE 1.68* 1.65* 1.58*  --   CALCIUM 9.1 9.3 8.8*  --   TSH 7.44* 4.48  --  5.963*   Liver Function Tests:  Recent Labs  02/19/16 1025  AST 20  ALT 7  ALKPHOS 72  BILITOT 0.6  PROT 6.4  ALBUMIN 3.7   No results for input(s): LIPASE, AMYLASE in the last 8760 hours. No results for input(s): AMMONIA in the last 8760 hours. CBC:  Recent Labs  10/18/16 2247  WBC 6.5  NEUTROABS 3.8  HGB 11.5*  HCT 35.1*  MCV  95.6  PLT 183   Lipid Panel:  Recent Labs  02/19/16 1025  CHOL 172  HDL 53  LDLCALC 93  TRIG 132  CHOLHDL 3.2   TSH:  Recent Labs  02/19/16 1025 08/14/16 0913 10/19/16 0020  TSH 7.44* 4.48 5.963*   A1C: Lab Results  Component Value Date   HGBA1C 5.6 08/14/2016     Assessment/Plan 1. Essential hypertension -blood pressure has improved but not at goal. -to cont on amlodipine-Benazepril 10-20 mg by mouth daily, Metoprolol Succinate 50 mg by mouth daily and will increase hydralazine. Currently on 10 mg TID.   - hydrALAZINE (APRESOLINE) 25 MG tablet; Take 1 tablet (25 mg total) by mouth 3 (three) times daily.  Dispense: 90 tablet; Refill: 1 - CMP with eGFR   Janett Billow  [facility], [facility] [phone] 8 am - 5 pm) [phone] (after hours)

## 2016-12-08 ENCOUNTER — Other Ambulatory Visit: Payer: Self-pay | Admitting: Nurse Practitioner

## 2016-12-30 ENCOUNTER — Other Ambulatory Visit: Payer: Self-pay | Admitting: *Deleted

## 2016-12-30 DIAGNOSIS — E039 Hypothyroidism, unspecified: Secondary | ICD-10-CM

## 2017-01-14 ENCOUNTER — Other Ambulatory Visit: Payer: Self-pay | Admitting: Nurse Practitioner

## 2017-01-14 DIAGNOSIS — I10 Essential (primary) hypertension: Secondary | ICD-10-CM

## 2017-01-27 ENCOUNTER — Other Ambulatory Visit: Payer: Self-pay | Admitting: Nurse Practitioner

## 2017-02-11 ENCOUNTER — Other Ambulatory Visit: Payer: Self-pay | Admitting: Internal Medicine

## 2017-02-11 DIAGNOSIS — I10 Essential (primary) hypertension: Secondary | ICD-10-CM

## 2017-02-11 NOTE — Telephone Encounter (Signed)
Per OV HCTZ was discontinued, and dose change on Lotrel, refill denied.

## 2017-02-17 ENCOUNTER — Other Ambulatory Visit: Payer: Medicare Other

## 2017-02-17 DIAGNOSIS — E039 Hypothyroidism, unspecified: Secondary | ICD-10-CM

## 2017-02-17 DIAGNOSIS — I10 Essential (primary) hypertension: Secondary | ICD-10-CM

## 2017-02-17 LAB — BASIC METABOLIC PANEL
BUN/Creatinine Ratio: 14 (calc) (ref 6–22)
BUN: 28 mg/dL — ABNORMAL HIGH (ref 7–25)
CO2: 27 mmol/L (ref 20–32)
Calcium: 9.3 mg/dL (ref 8.6–10.4)
Chloride: 105 mmol/L (ref 98–110)
Creat: 1.98 mg/dL — ABNORMAL HIGH (ref 0.60–0.88)
Glucose, Bld: 95 mg/dL (ref 65–99)
Potassium: 3.9 mmol/L (ref 3.5–5.3)
Sodium: 140 mmol/L (ref 135–146)

## 2017-02-17 LAB — TSH: TSH: 4.68 mIU/L — ABNORMAL HIGH (ref 0.40–4.50)

## 2017-02-20 ENCOUNTER — Encounter: Payer: Self-pay | Admitting: Internal Medicine

## 2017-02-20 ENCOUNTER — Ambulatory Visit (INDEPENDENT_AMBULATORY_CARE_PROVIDER_SITE_OTHER): Payer: Medicare Other | Admitting: Internal Medicine

## 2017-02-20 ENCOUNTER — Other Ambulatory Visit: Payer: Self-pay | Admitting: *Deleted

## 2017-02-20 VITALS — BP 130/68 | HR 69 | Temp 98.5°F | Wt 145.0 lb

## 2017-02-20 DIAGNOSIS — R3981 Functional urinary incontinence: Secondary | ICD-10-CM | POA: Diagnosis not present

## 2017-02-20 DIAGNOSIS — R413 Other amnesia: Secondary | ICD-10-CM | POA: Diagnosis not present

## 2017-02-20 DIAGNOSIS — I10 Essential (primary) hypertension: Secondary | ICD-10-CM | POA: Diagnosis not present

## 2017-02-20 DIAGNOSIS — Z23 Encounter for immunization: Secondary | ICD-10-CM | POA: Diagnosis not present

## 2017-02-20 DIAGNOSIS — N182 Chronic kidney disease, stage 2 (mild): Secondary | ICD-10-CM | POA: Diagnosis not present

## 2017-02-20 DIAGNOSIS — E039 Hypothyroidism, unspecified: Secondary | ICD-10-CM | POA: Diagnosis not present

## 2017-02-20 MED ORDER — ZOSTER VAC RECOMB ADJUVANTED 50 MCG/0.5ML IM SUSR: 0.5000 mL | Freq: Once | INTRAMUSCULAR | 1 refills | Status: AC

## 2017-02-20 MED ORDER — HYDRALAZINE HCL 25 MG PO TABS: 25.0000 mg | ORAL_TABLET | Freq: Every day | ORAL | 0 refills | Status: DC

## 2017-02-20 MED ORDER — LEVOTHYROXINE SODIUM 100 MCG PO TABS: 100.0000 ug | ORAL_TABLET | Freq: Every day | ORAL | 3 refills | Status: AC

## 2017-02-20 NOTE — Progress Notes (Signed)
Location:  [facility] clinic Provider:  Gabrian Hoque L. Mariea Clonts, D.O., C.M.D.  Code Status: DNR Goals of Care:  Advanced Directives 02/20/2017  Does Patient Have a Medical Advance Directive? Yes  Type of Advance Directive [place] in Chart? Yes  Would patient like information on creating a medical advance directive? -   Chief Complaint  Patient presents with  . Medical Management of Chronic Issues    54mth follow-up, transfer fro Dr. Nyoka Cowden    HPI: Patient is a 83 y.o. female seen today for medical management of chronic diseases and to establish with me after Dr. Rolly Salter retirement. She is here with her son, [name], and her daughter-in-law.   She saw Janett Billow in July due to elevated. Hydralazine was added to her regimen 25mg  po tid.    She often was forgetting 2/3 of the hydralazines.  No increase in edema in feet.    Not having any pain.  Says she's blessed.    HOH:  Does not wear her hearing aid.  It bothers her to hear her own voice.   CKD:  Says she could drink more water.  Renal function a lot worse.  Drink some coffee.  Does not drink soda.  Sips a cup of coffee.  Drinks some water.  She lives alone in a [place] home [facility] [place]    Memory loss:  An aide comes twice a week to help her shower.  She has difficulty remembering her pills.  She does use a pillbox.  [name] reminds her of the appts.  She is good about all her am pills.  MMSE was 24/30 in March. She does wear depends and does not make it in time.   Hypothyroidism.  Increase levothyroxine due to high tsh.  Pt is taking, but will miss if she has to take it separate from her other meds.  Denies anxiety.  Says she can sleep anytime.  She's been worried about her neighbor who's now on hospice.    Says she sees pretty well but needs new glasses.  Has dry eyes.  Unclear if she uses the dry eye drops that are prescribed.  Was about a year ago.  Saw Dr. [place]  No falls in the last 9-10  mos.  Had a fall early in the year when she had some orthostatic hypotension.  Past Medical History:  Diagnosis Date  . Anxiety state, unspecified   . Dysthymic disorder   . First degree atrioventricular block   . Hyperglycemia 01/30/09  . Malignant neoplasm of breast (female), unspecified site   . Osteoarthrosis, unspecified whether generalized or localized, unspecified site   . Senile osteoporosis   . Trigger finger (acquired)   . Unspecified constipation   . Unspecified essential hypertension   . Unspecified hypothyroidism   . Unspecified vitamin D deficiency     Past Surgical History:  Procedure Laterality Date  . CATARACT EXTRACTION BILATERAL W/ ANTERIOR VITRECTOMY Bilateral 2002  . MASTECTOMY Left 1986   Dr.Weatherly   . ORIF FACIAL FRACTURE  1962    Allergies  Allergen Reactions  . Macrobid [Nitrofurantoin Macrocrystal]     Outpatient Encounter Prescriptions as of 02/20/2017  Medication Sig  . amLODipine-benazepril (LOTREL) 10-20 MG capsule TAKE 1 CAPSULE BY MOUTH DAILY  . aspirin EC 81 MG tablet Take 1 tablet (81 mg total) by mouth daily.  . chlordiazePOXIDE (LIBRIUM) 10 MG capsule Take one capsule daily as needed for rest or nerves  .  hydrALAZINE (APRESOLINE) 25 MG tablet TAKE 1 TABLET(25 MG) BY MOUTH THREE TIMES DAILY  . levothyroxine (SYNTHROID, LEVOTHROID) 100 MCG tablet Take 1 tablet (100 mcg total) by mouth daily.  . metoprolol succinate (TOPROL-XL) 50 MG 24 hr tablet TAKE 1 TABLET BY MOUTH DAILY TO CONTROL BLOOD PRESSURE AND HEART RHYTHM.(TAKE WITH OR IMMEDIATELY FOLLOWING A MEAL)  . [DISCONTINUED] levothyroxine (SYNTHROID, LEVOTHROID) 88 MCG tablet TAKE 1 TABLET BY MOUTH DAILY FOR THYROID SUPPLEMENT   No facility-administered encounter medications on file as of 02/20/2017.     Review of Systems:  Review of Systems  Constitutional: Positive for malaise/fatigue. Negative for chills and fever.  HENT: Positive for hearing loss.   Eyes:       Dry eyes, glasses   Respiratory: Negative for shortness of breath.   Cardiovascular: Negative for chest pain, palpitations and leg swelling.  Gastrointestinal: Negative for abdominal pain.  Genitourinary:       Urinary incontinence  Musculoskeletal: Negative for falls.  Skin: Negative for itching and rash.  Neurological: Positive for weakness. Negative for dizziness and loss of consciousness.  Endo/Heme/Allergies:       Hyperglycemia  Psychiatric/Behavioral: Positive for memory loss. Negative for depression. The patient is not nervous/anxious and does not have insomnia.     Health Maintenance  Topic Date Due  . INFLUENZA VACCINE  12/18/2016  . TETANUS/TDAP  05/20/2018 (Originally 03/25/2010)  . DEXA SCAN  Completed  . PNA vac Low Risk Adult  Completed    Physical Exam: Vitals:   02/20/17 1043  BP: 130/68  Pulse: 69  Temp: 98.5 F (36.9 C)  TempSrc: Oral  SpO2: 96%  Weight: 145 lb (65.8 kg)   Body mass index is 29.29 kg/m. Physical Exam  Constitutional: She appears well-developed and well-nourished. No distress.  HENT:  Head: Normocephalic and atraumatic.  hearing aids  Eyes:  glasses  Cardiovascular: Normal rate, regular rhythm, normal heart sounds and intact distal pulses.   Pulmonary/Chest: Effort normal and breath sounds normal. No respiratory distress.  Abdominal: Bowel sounds are normal.  Musculoskeletal: Normal range of motion.  Neurological: She is alert.  Pleasantly confused, not a good historian, but son and daughter in law helped with history  Skin: Skin is warm and dry. Capillary refill takes less than 2 seconds.  Psychiatric: She has a normal mood and affect.    Labs reviewed: Basic Metabolic Panel:  Recent Labs  08/14/16 0913 10/18/16 2247 10/19/16 0020 11/18/16 1442 02/17/17 0845  NA 140 137  --  140 140  K 3.9 2.9*  --  4.3 3.9  CL 104 102  --  105 105  CO2 27 24  --  21 27  GLUCOSE 92 173*  --  102* 95  BUN 24 23*  --  25 28*  CREATININE 1.65* 1.58*  --   1.58* 1.98*  CALCIUM 9.3 8.8*  --  9.3 9.3  TSH 4.48  --  5.963*  --  4.68*   Liver Function Tests:  Recent Labs  11/18/16 1442  AST 19  ALT 8  ALKPHOS 82  BILITOT 0.4  PROT 7.1  ALBUMIN 3.9   No results for input(s): LIPASE, AMYLASE in the last 8760 hours. No results for input(s): AMMONIA in the last 8760 hours. CBC:  Recent Labs  10/18/16 2247  WBC 6.5  NEUTROABS 3.8  HGB 11.5*  HCT 35.1*  MCV 95.6  PLT 183   Lipid Panel: No results for input(s): CHOL, HDL, LDLCALC, TRIG, CHOLHDL, LDLDIRECT in the  last 8760 hours. Lab Results  Component Value Date   HGBA1C 5.6 08/14/2016    Assessment/Plan 1. Essential hypertension -bp at goal and pt only taking the hydrazaline once a day rather than tid but bp at goal anyway so directions revised - hydrALAZINE (APRESOLINE) 25 MG tablet; Take 1 tablet (25 mg total) by mouth daily.  Dispense: 90 tablet; Refill: 0 - Basic metabolic panel; Future - CBC with Differential/Platelet; Future  2. Chronic kidney disease (CKD) stage G2/A2, mildly decreased glomerular filtration rate (GFR) between 60-89 mL/min/1.73 square meter and albuminuria creatinine ratio between 30-299 mg/g -Avoid nephrotoxic agents like nsaids, dose adjust renally excreted meds, hydrate. - Basic metabolic panel; Future - CBC with Differential/Platelet; Future  3. Memory impairment -has some moderate dementia, it seems, last mmse 24/30, likely AD -is also on librium for anxiety at hs--may contribute to her cognitive loss--would favor changing to something better in her age group in the future (cognition may improve with trazodone or remeron instead of librium)  4. Hypothyroidism, unspecified type -TSH mildly elevated, increased levothyroxine to 187mcg daily--due to dementia, pt can only remember to take her meds all at once in the am so hard to get TSH controlled  5. Functional urinary incontinence -ongoing, due to dementia progression and frailty  6. Need for  influenza vaccination - Flu vaccine HIGH DOSE PF (Fluzone High dose)  7. Need for shingles vaccine -Rx sent to pharmacy - Zoster Vac Recomb Adjuvanted [facility]) injection; Inject 0.5 mLs into the muscle once.  Dispense: 0.5 mL; Refill: 1  Labs/tests ordered:  Orders Placed This Encounter  Procedures  . Flu vaccine HIGH DOSE PF (Fluzone High dose)  . Basic metabolic panel    Standing Status:   Future    Standing Expiration Date:   10/21/2017    Order Specific Question:   Has the patient fasted?    Answer:   Yes  . CBC with Differential/Platelet    Standing Status:   Future    Standing Expiration Date:   10/21/2017    Next appt:  06/26/2017 med mgt, labs before  Lilee Aldea L. Olsen Mccutchan, D.O. [facility] [address], [place] [address] Cell Phone (Mon-Fri 8am-5pm):  [phone] On Call:  [phone] & follow prompts after 5pm & weekends Office Phone:  [phone] Office Fax:  [phone]

## 2017-02-20 NOTE — Patient Instructions (Signed)
Try to drink at least 6 8oz glasses of water each day.

## 2017-03-12 DIAGNOSIS — S064X0A Epidural hemorrhage without loss of consciousness, initial encounter: Secondary | ICD-10-CM | POA: Diagnosis not present

## 2017-03-12 DIAGNOSIS — S0990XA Unspecified injury of head, initial encounter: Secondary | ICD-10-CM | POA: Diagnosis not present

## 2017-03-13 ENCOUNTER — Other Ambulatory Visit: Payer: Self-pay | Admitting: Internal Medicine

## 2017-03-13 DIAGNOSIS — I10 Essential (primary) hypertension: Secondary | ICD-10-CM

## 2017-03-27 DIAGNOSIS — R296 Repeated falls: Secondary | ICD-10-CM | POA: Diagnosis not present

## 2017-03-27 DIAGNOSIS — R2689 Other abnormalities of gait and mobility: Secondary | ICD-10-CM | POA: Diagnosis not present

## 2017-03-27 DIAGNOSIS — I129 Hypertensive chronic kidney disease with stage 1 through stage 4 chronic kidney disease, or unspecified chronic kidney disease: Secondary | ICD-10-CM | POA: Diagnosis not present

## 2017-03-27 DIAGNOSIS — F0390 Unspecified dementia without behavioral disturbance: Secondary | ICD-10-CM | POA: Diagnosis not present

## 2017-03-27 DIAGNOSIS — Z853 Personal history of malignant neoplasm of breast: Secondary | ICD-10-CM | POA: Diagnosis not present

## 2017-03-27 DIAGNOSIS — N182 Chronic kidney disease, stage 2 (mild): Secondary | ICD-10-CM | POA: Diagnosis not present

## 2017-03-27 DIAGNOSIS — Z9012 Acquired absence of left breast and nipple: Secondary | ICD-10-CM | POA: Diagnosis not present

## 2017-03-28 ENCOUNTER — Telehealth: Payer: Self-pay | Admitting: *Deleted

## 2017-03-28 ENCOUNTER — Telehealth: Payer: Self-pay

## 2017-03-28 NOTE — Telephone Encounter (Signed)
Betsy with [facility] called requesting verbal orders for PT 2X4wks. Verbal orders given.

## 2017-03-28 NOTE — Telephone Encounter (Signed)
[name] from [facility] called and stated that the patient have declined OT services at the moment due to the patient being to social.

## 2017-03-30 NOTE — Telephone Encounter (Signed)
Noted.  I ordered her therapy over a month ago.

## 2017-03-31 DIAGNOSIS — Z853 Personal history of malignant neoplasm of breast: Secondary | ICD-10-CM | POA: Diagnosis not present

## 2017-03-31 DIAGNOSIS — R2689 Other abnormalities of gait and mobility: Secondary | ICD-10-CM | POA: Diagnosis not present

## 2017-03-31 DIAGNOSIS — F0390 Unspecified dementia without behavioral disturbance: Secondary | ICD-10-CM | POA: Diagnosis not present

## 2017-03-31 DIAGNOSIS — I129 Hypertensive chronic kidney disease with stage 1 through stage 4 chronic kidney disease, or unspecified chronic kidney disease: Secondary | ICD-10-CM | POA: Diagnosis not present

## 2017-03-31 DIAGNOSIS — N182 Chronic kidney disease, stage 2 (mild): Secondary | ICD-10-CM | POA: Diagnosis not present

## 2017-03-31 DIAGNOSIS — R296 Repeated falls: Secondary | ICD-10-CM | POA: Diagnosis not present

## 2017-04-02 DIAGNOSIS — I129 Hypertensive chronic kidney disease with stage 1 through stage 4 chronic kidney disease, or unspecified chronic kidney disease: Secondary | ICD-10-CM | POA: Diagnosis not present

## 2017-04-02 DIAGNOSIS — F0390 Unspecified dementia without behavioral disturbance: Secondary | ICD-10-CM | POA: Diagnosis not present

## 2017-04-02 DIAGNOSIS — R2689 Other abnormalities of gait and mobility: Secondary | ICD-10-CM | POA: Diagnosis not present

## 2017-04-02 DIAGNOSIS — R296 Repeated falls: Secondary | ICD-10-CM | POA: Diagnosis not present

## 2017-04-02 DIAGNOSIS — Z853 Personal history of malignant neoplasm of breast: Secondary | ICD-10-CM | POA: Diagnosis not present

## 2017-04-02 DIAGNOSIS — N182 Chronic kidney disease, stage 2 (mild): Secondary | ICD-10-CM | POA: Diagnosis not present

## 2017-04-07 DIAGNOSIS — R296 Repeated falls: Secondary | ICD-10-CM | POA: Diagnosis not present

## 2017-04-07 DIAGNOSIS — F0390 Unspecified dementia without behavioral disturbance: Secondary | ICD-10-CM | POA: Diagnosis not present

## 2017-04-07 DIAGNOSIS — Z853 Personal history of malignant neoplasm of breast: Secondary | ICD-10-CM | POA: Diagnosis not present

## 2017-04-07 DIAGNOSIS — I129 Hypertensive chronic kidney disease with stage 1 through stage 4 chronic kidney disease, or unspecified chronic kidney disease: Secondary | ICD-10-CM | POA: Diagnosis not present

## 2017-04-07 DIAGNOSIS — R2689 Other abnormalities of gait and mobility: Secondary | ICD-10-CM | POA: Diagnosis not present

## 2017-04-07 DIAGNOSIS — N182 Chronic kidney disease, stage 2 (mild): Secondary | ICD-10-CM | POA: Diagnosis not present

## 2017-04-09 ENCOUNTER — Other Ambulatory Visit: Payer: Self-pay | Admitting: Internal Medicine

## 2017-04-09 DIAGNOSIS — I10 Essential (primary) hypertension: Secondary | ICD-10-CM

## 2017-04-09 DIAGNOSIS — I129 Hypertensive chronic kidney disease with stage 1 through stage 4 chronic kidney disease, or unspecified chronic kidney disease: Secondary | ICD-10-CM | POA: Diagnosis not present

## 2017-04-09 DIAGNOSIS — R2689 Other abnormalities of gait and mobility: Secondary | ICD-10-CM | POA: Diagnosis not present

## 2017-04-09 DIAGNOSIS — N182 Chronic kidney disease, stage 2 (mild): Secondary | ICD-10-CM | POA: Diagnosis not present

## 2017-04-09 DIAGNOSIS — R296 Repeated falls: Secondary | ICD-10-CM | POA: Diagnosis not present

## 2017-04-09 DIAGNOSIS — Z853 Personal history of malignant neoplasm of breast: Secondary | ICD-10-CM | POA: Diagnosis not present

## 2017-04-09 DIAGNOSIS — F0390 Unspecified dementia without behavioral disturbance: Secondary | ICD-10-CM | POA: Diagnosis not present

## 2017-04-14 DIAGNOSIS — Z853 Personal history of malignant neoplasm of breast: Secondary | ICD-10-CM | POA: Diagnosis not present

## 2017-04-14 DIAGNOSIS — F0390 Unspecified dementia without behavioral disturbance: Secondary | ICD-10-CM | POA: Diagnosis not present

## 2017-04-14 DIAGNOSIS — I129 Hypertensive chronic kidney disease with stage 1 through stage 4 chronic kidney disease, or unspecified chronic kidney disease: Secondary | ICD-10-CM | POA: Diagnosis not present

## 2017-04-14 DIAGNOSIS — N182 Chronic kidney disease, stage 2 (mild): Secondary | ICD-10-CM | POA: Diagnosis not present

## 2017-04-14 DIAGNOSIS — R296 Repeated falls: Secondary | ICD-10-CM | POA: Diagnosis not present

## 2017-04-14 DIAGNOSIS — R2689 Other abnormalities of gait and mobility: Secondary | ICD-10-CM | POA: Diagnosis not present

## 2017-04-16 DIAGNOSIS — I129 Hypertensive chronic kidney disease with stage 1 through stage 4 chronic kidney disease, or unspecified chronic kidney disease: Secondary | ICD-10-CM | POA: Diagnosis not present

## 2017-04-16 DIAGNOSIS — Z853 Personal history of malignant neoplasm of breast: Secondary | ICD-10-CM | POA: Diagnosis not present

## 2017-04-16 DIAGNOSIS — N182 Chronic kidney disease, stage 2 (mild): Secondary | ICD-10-CM | POA: Diagnosis not present

## 2017-04-16 DIAGNOSIS — F0390 Unspecified dementia without behavioral disturbance: Secondary | ICD-10-CM | POA: Diagnosis not present

## 2017-04-16 DIAGNOSIS — R2689 Other abnormalities of gait and mobility: Secondary | ICD-10-CM | POA: Diagnosis not present

## 2017-04-16 DIAGNOSIS — R296 Repeated falls: Secondary | ICD-10-CM | POA: Diagnosis not present

## 2017-04-21 ENCOUNTER — Telehealth: Payer: Self-pay | Admitting: *Deleted

## 2017-04-21 DIAGNOSIS — F0390 Unspecified dementia without behavioral disturbance: Secondary | ICD-10-CM | POA: Diagnosis not present

## 2017-04-21 DIAGNOSIS — R296 Repeated falls: Secondary | ICD-10-CM | POA: Diagnosis not present

## 2017-04-21 DIAGNOSIS — I129 Hypertensive chronic kidney disease with stage 1 through stage 4 chronic kidney disease, or unspecified chronic kidney disease: Secondary | ICD-10-CM | POA: Diagnosis not present

## 2017-04-21 DIAGNOSIS — R2689 Other abnormalities of gait and mobility: Secondary | ICD-10-CM | POA: Diagnosis not present

## 2017-04-21 DIAGNOSIS — N182 Chronic kidney disease, stage 2 (mild): Secondary | ICD-10-CM | POA: Diagnosis not present

## 2017-04-21 DIAGNOSIS — Z853 Personal history of malignant neoplasm of breast: Secondary | ICD-10-CM | POA: Diagnosis not present

## 2017-04-21 NOTE — Telephone Encounter (Signed)
Betsy with [facility] called and stated that she needed a verbal order for patient to continue PT 2X2wks. Verbal order given.

## 2017-04-23 ENCOUNTER — Other Ambulatory Visit: Payer: Self-pay | Admitting: Internal Medicine

## 2017-04-23 DIAGNOSIS — F0390 Unspecified dementia without behavioral disturbance: Secondary | ICD-10-CM | POA: Diagnosis not present

## 2017-04-23 DIAGNOSIS — R296 Repeated falls: Secondary | ICD-10-CM | POA: Diagnosis not present

## 2017-04-23 DIAGNOSIS — Z853 Personal history of malignant neoplasm of breast: Secondary | ICD-10-CM | POA: Diagnosis not present

## 2017-04-23 DIAGNOSIS — R2689 Other abnormalities of gait and mobility: Secondary | ICD-10-CM | POA: Diagnosis not present

## 2017-04-23 DIAGNOSIS — I129 Hypertensive chronic kidney disease with stage 1 through stage 4 chronic kidney disease, or unspecified chronic kidney disease: Secondary | ICD-10-CM | POA: Diagnosis not present

## 2017-04-23 DIAGNOSIS — N182 Chronic kidney disease, stage 2 (mild): Secondary | ICD-10-CM | POA: Diagnosis not present

## 2017-04-29 DIAGNOSIS — R2689 Other abnormalities of gait and mobility: Secondary | ICD-10-CM | POA: Diagnosis not present

## 2017-04-29 DIAGNOSIS — R296 Repeated falls: Secondary | ICD-10-CM | POA: Diagnosis not present

## 2017-04-29 DIAGNOSIS — F0390 Unspecified dementia without behavioral disturbance: Secondary | ICD-10-CM | POA: Diagnosis not present

## 2017-04-29 DIAGNOSIS — Z853 Personal history of malignant neoplasm of breast: Secondary | ICD-10-CM | POA: Diagnosis not present

## 2017-04-29 DIAGNOSIS — I129 Hypertensive chronic kidney disease with stage 1 through stage 4 chronic kidney disease, or unspecified chronic kidney disease: Secondary | ICD-10-CM | POA: Diagnosis not present

## 2017-04-29 DIAGNOSIS — N182 Chronic kidney disease, stage 2 (mild): Secondary | ICD-10-CM | POA: Diagnosis not present

## 2017-05-01 DIAGNOSIS — R2689 Other abnormalities of gait and mobility: Secondary | ICD-10-CM | POA: Diagnosis not present

## 2017-05-01 DIAGNOSIS — Z853 Personal history of malignant neoplasm of breast: Secondary | ICD-10-CM | POA: Diagnosis not present

## 2017-05-01 DIAGNOSIS — I129 Hypertensive chronic kidney disease with stage 1 through stage 4 chronic kidney disease, or unspecified chronic kidney disease: Secondary | ICD-10-CM | POA: Diagnosis not present

## 2017-05-01 DIAGNOSIS — R296 Repeated falls: Secondary | ICD-10-CM | POA: Diagnosis not present

## 2017-05-01 DIAGNOSIS — N182 Chronic kidney disease, stage 2 (mild): Secondary | ICD-10-CM | POA: Diagnosis not present

## 2017-05-01 DIAGNOSIS — F0390 Unspecified dementia without behavioral disturbance: Secondary | ICD-10-CM | POA: Diagnosis not present

## 2017-05-06 DIAGNOSIS — I129 Hypertensive chronic kidney disease with stage 1 through stage 4 chronic kidney disease, or unspecified chronic kidney disease: Secondary | ICD-10-CM | POA: Diagnosis not present

## 2017-05-06 DIAGNOSIS — N182 Chronic kidney disease, stage 2 (mild): Secondary | ICD-10-CM | POA: Diagnosis not present

## 2017-05-06 DIAGNOSIS — R2689 Other abnormalities of gait and mobility: Secondary | ICD-10-CM | POA: Diagnosis not present

## 2017-05-06 DIAGNOSIS — F0390 Unspecified dementia without behavioral disturbance: Secondary | ICD-10-CM | POA: Diagnosis not present

## 2017-05-06 DIAGNOSIS — Z853 Personal history of malignant neoplasm of breast: Secondary | ICD-10-CM | POA: Diagnosis not present

## 2017-05-06 DIAGNOSIS — R296 Repeated falls: Secondary | ICD-10-CM | POA: Diagnosis not present

## 2017-05-07 DIAGNOSIS — N182 Chronic kidney disease, stage 2 (mild): Secondary | ICD-10-CM | POA: Diagnosis not present

## 2017-05-07 DIAGNOSIS — R2689 Other abnormalities of gait and mobility: Secondary | ICD-10-CM | POA: Diagnosis not present

## 2017-05-07 DIAGNOSIS — I129 Hypertensive chronic kidney disease with stage 1 through stage 4 chronic kidney disease, or unspecified chronic kidney disease: Secondary | ICD-10-CM | POA: Diagnosis not present

## 2017-05-07 DIAGNOSIS — F0390 Unspecified dementia without behavioral disturbance: Secondary | ICD-10-CM | POA: Diagnosis not present

## 2017-05-07 DIAGNOSIS — Z853 Personal history of malignant neoplasm of breast: Secondary | ICD-10-CM | POA: Diagnosis not present

## 2017-05-07 DIAGNOSIS — R296 Repeated falls: Secondary | ICD-10-CM | POA: Diagnosis not present

## 2017-05-27 ENCOUNTER — Emergency Department (HOSPITAL_COMMUNITY)
Admission: EM | Admit: 2017-05-27 | Discharge: 2017-05-28 | Disposition: A | Discharge status: Home / Self Care | Payer: Medicare Other | Attending: Emergency Medicine | Admitting: Emergency Medicine

## 2017-05-27 ENCOUNTER — Emergency Department (HOSPITAL_COMMUNITY): Payer: Medicare Other

## 2017-05-27 ENCOUNTER — Encounter (HOSPITAL_COMMUNITY): Payer: Self-pay

## 2017-05-27 DIAGNOSIS — Z79899 Other long term (current) drug therapy: Secondary | ICD-10-CM | POA: Insufficient documentation

## 2017-05-27 DIAGNOSIS — N182 Chronic kidney disease, stage 2 (mild): Secondary | ICD-10-CM | POA: Diagnosis not present

## 2017-05-27 DIAGNOSIS — B9620 Unspecified Escherichia coli [E. coli] as the cause of diseases classified elsewhere: Secondary | ICD-10-CM | POA: Diagnosis not present

## 2017-05-27 DIAGNOSIS — Y9301 Activity, walking, marching and hiking: Secondary | ICD-10-CM | POA: Diagnosis not present

## 2017-05-27 DIAGNOSIS — Z853 Personal history of malignant neoplasm of breast: Secondary | ICD-10-CM | POA: Diagnosis not present

## 2017-05-27 DIAGNOSIS — Y999 Unspecified external cause status: Secondary | ICD-10-CM | POA: Insufficient documentation

## 2017-05-27 DIAGNOSIS — N183 Chronic kidney disease, stage 3 (moderate): Secondary | ICD-10-CM | POA: Diagnosis not present

## 2017-05-27 DIAGNOSIS — R079 Chest pain, unspecified: Secondary | ICD-10-CM | POA: Diagnosis not present

## 2017-05-27 DIAGNOSIS — W19XXXA Unspecified fall, initial encounter: Secondary | ICD-10-CM

## 2017-05-27 DIAGNOSIS — Y9210 Unspecified residential institution as the place of occurrence of the external cause: Secondary | ICD-10-CM | POA: Insufficient documentation

## 2017-05-27 DIAGNOSIS — W0110XA Fall on same level from slipping, tripping and stumbling with subsequent striking against unspecified object, initial encounter: Secondary | ICD-10-CM | POA: Diagnosis not present

## 2017-05-27 DIAGNOSIS — Z7982 Long term (current) use of aspirin: Secondary | ICD-10-CM | POA: Diagnosis not present

## 2017-05-27 DIAGNOSIS — R0789 Other chest pain: Secondary | ICD-10-CM | POA: Insufficient documentation

## 2017-05-27 DIAGNOSIS — I129 Hypertensive chronic kidney disease with stage 1 through stage 4 chronic kidney disease, or unspecified chronic kidney disease: Secondary | ICD-10-CM | POA: Diagnosis not present

## 2017-05-27 DIAGNOSIS — S32591A Other specified fracture of right pubis, initial encounter for closed fracture: Secondary | ICD-10-CM | POA: Diagnosis not present

## 2017-05-27 DIAGNOSIS — E039 Hypothyroidism, unspecified: Secondary | ICD-10-CM | POA: Insufficient documentation

## 2017-05-27 DIAGNOSIS — N390 Urinary tract infection, site not specified: Secondary | ICD-10-CM | POA: Diagnosis not present

## 2017-05-27 DIAGNOSIS — I517 Cardiomegaly: Secondary | ICD-10-CM | POA: Diagnosis not present

## 2017-05-27 DIAGNOSIS — S279XXA Injury of unspecified intrathoracic organ, initial encounter: Secondary | ICD-10-CM | POA: Diagnosis not present

## 2017-05-27 DIAGNOSIS — S299XXA Unspecified injury of thorax, initial encounter: Secondary | ICD-10-CM | POA: Diagnosis not present

## 2017-05-27 DIAGNOSIS — N179 Acute kidney failure, unspecified: Secondary | ICD-10-CM | POA: Diagnosis not present

## 2017-05-27 DIAGNOSIS — S32501S Unspecified fracture of right pubis, sequela: Secondary | ICD-10-CM | POA: Diagnosis not present

## 2017-05-27 DIAGNOSIS — S32511A Fracture of superior rim of right pubis, initial encounter for closed fracture: Secondary | ICD-10-CM | POA: Diagnosis not present

## 2017-05-27 MED ORDER — TRAMADOL HCL 50 MG PO TABS: ORAL_TABLET | ORAL | 0 refills | Status: DC

## 2017-05-27 MED ORDER — ACETAMINOPHEN 500 MG PO TABS: 1000.0000 mg | ORAL_TABLET | Freq: Four times a day (QID) | ORAL | 0 refills | Status: AC | PRN

## 2017-05-27 MED ORDER — TRAMADOL HCL 50 MG PO TABS
50.0000 mg | ORAL_TABLET | Freq: Once | ORAL | Status: AC
  Administered 2017-05-28: 50 mg via ORAL
  Filled 2017-05-27: qty 1

## 2017-05-27 MED ORDER — ACETAMINOPHEN 500 MG PO TABS
1000.0000 mg | ORAL_TABLET | Freq: Once | ORAL | Status: AC
  Administered 2017-05-28: 1000 mg via ORAL
  Filled 2017-05-27: qty 2

## 2017-05-27 NOTE — ED Notes (Signed)
Bed: WA03 Expected date:  Expected time:  Means of arrival:  Comments: EMS 84 yo upper chest pain

## 2017-05-27 NOTE — ED Provider Notes (Signed)
[facility] DEPT Provider Note   CSN: [id number] Arrival date & time: 05/27/17  1850     History   Chief Complaint Chief Complaint  Patient presents with  . Chest Pain  . Fall    HPI Diana Anthony is a 84 y.o. female.  HPI Patient reports she lost her balance at home and fell.  She reports she landed onto her buttocks and did not feel like she had too much pain.  She reports after a little while though she started to hurt in the front of her chest.  It hurts with any movements or pressure to the left side of her chest.  No shortness of breath.  No loss of consciousness.  No head injury.  No headache no neck pain. Past Medical History:  Diagnosis Date  . Anxiety state, unspecified   . Dysthymic disorder   . First degree atrioventricular block   . Hyperglycemia 01/30/09  . Malignant neoplasm of breast (female), unspecified site   . Osteoarthrosis, unspecified whether generalized or localized, unspecified site   . Senile osteoporosis   . Trigger finger (acquired)   . Unspecified constipation   . Unspecified essential hypertension   . Unspecified hypothyroidism   . Unspecified vitamin D deficiency     Patient Active Problem List   Diagnosis Date Noted  . Chronic kidney disease (CKD) stage G2/A2, mildly decreased glomerular filtration rate (GFR) between 60-89 mL/min/1.73 square meter and albuminuria creatinine ratio between 30-299 mg/g 01/31/2015  . Memory impairment 01/31/2015  . Osteoarthritis of right knee 02/24/2014  . Urinary tract infection, site not specified 12/16/2012  . Unspecified vitamin D deficiency   . Anxiety state   . History of breast cancer in female   . Hypothyroidism   . Essential hypertension   . Hyperglycemia 01/30/2009    Past Surgical History:  Procedure Laterality Date  . CATARACT EXTRACTION BILATERAL W/ ANTERIOR VITRECTOMY Bilateral 2002  . MASTECTOMY Left 1986   Dr.Weatherly   . ORIF FACIAL FRACTURE  1962     OB History    No data available       Home Medications    Prior to Admission medications   Medication Sig Start Date End Date Taking? Authorizing Provider  amLODipine-benazepril (LOTREL) 10-20 MG capsule TAKE 1 CAPSULE BY MOUTH DAILY 04/23/17  Yes Reed, Tiffany L, DO  aspirin EC 81 MG tablet Take 1 tablet (81 mg total) by mouth daily. 10/28/16  Yes Lauree Chandler, NP  hydrALAZINE (APRESOLINE) 25 MG tablet Take 1 tablet (25 mg total) by mouth daily. 03/13/17  Yes Reed, Tiffany L, DO  levothyroxine (SYNTHROID, LEVOTHROID) 100 MCG tablet Take 1 tablet (100 mcg total) by mouth daily. 02/20/17  Yes Reed, Tiffany L, DO  metoprolol succinate (TOPROL-XL) 50 MG 24 hr tablet TAKE 1 TABLET BY MOUTH DAILY TO CONTROL BLOOD PRESSURE AND HEART RHYTHM.(TAKE WITH OR IMMEDIATELY FOLLOWING A MEAL) 04/09/17  Yes Blanchie Serve, MD  acetaminophen (TYLENOL) 500 MG tablet Take 2 tablets (1,000 mg total) by mouth every 6 (six) hours as needed. 05/27/17   Charlesetta Shanks, MD  chlordiazePOXIDE (LIBRIUM) 10 MG capsule Take one capsule daily as needed for rest or nerves Patient not taking: Reported on 05/27/2017 01/31/15   [name], MD  traMADol (ULTRAM) 50 MG tablet 2 tablets every 6 hours as needed for pain.  You may take with Tylenol. 05/27/17   Charlesetta Shanks, MD    Family History Family History  Problem Relation Age of  Onset  . Cerebrovascular Accident Mother   . Hypertension Mother   . Cerebrovascular Accident Father   . Heart disease Sister   . Emphysema Brother     Social History Social History   Tobacco Use  . Smoking status: Never Smoker  . Smokeless tobacco: Never Used  Substance Use Topics  . Alcohol use: No    Alcohol/week: 0.0 oz  . Drug use: No     Allergies   Macrobid [nitrofurantoin macrocrystal]   Review of Systems Review of Systems 10 Systems reviewed and are negative for acute change except as noted in the HPI.   Physical Exam Updated Vital Signs BP (!) 152/70  (BP Location: Right Arm)   Pulse 87   Temp 98.3 F (36.8 C) (Oral)   Resp (!) 24   SpO2 92%   Physical Exam  Constitutional: She is oriented to person, place, and time. She appears well-developed and well-nourished. No distress.  HENT:  Head: Normocephalic and atraumatic.  Nose: Nose normal.  Mouth/Throat: Oropharynx is clear and moist.  Eyes: Conjunctivae and EOM are normal.  Neck: Neck supple.  No C-spine tenderness.  Cardiovascular: Normal rate and regular rhythm.  No murmur heard. Pulmonary/Chest: Effort normal and breath sounds normal. No respiratory distress. She exhibits tenderness.  Chest wall tender to palpation left anterior.  Crepitus and no bruising.  Abdominal: Soft. She exhibits no distension. There is no tenderness. There is no guarding.  Musculoskeletal: Normal range of motion. She exhibits no edema, tenderness or deformity.  Neurological: She is alert and oriented to person, place, and time. No cranial nerve deficit. She exhibits normal muscle tone. Coordination normal.  Skin: Skin is warm and dry.  Psychiatric: She has a normal mood and affect.  Nursing note and vitals reviewed.    ED Treatments / Results  Labs (all labs ordered are listed, but only abnormal results are displayed) Labs Reviewed - No data to display  EKG  EKG Interpretation  Date/Time:  Tuesday May 27 2017 19:02:17 EST Ventricular Rate:  76 PR Interval:    QRS Duration: 94 QT Interval:  386 QTC Calculation: 434 R Axis:   7 Text Interpretation:  Sinus rhythm Atrial premature complexes Borderline prolonged PR interval Baseline wander in lead(s) V5 agree. no acute ischemic appearance Confirmed by Charlesetta Shanks [phone]) on 05/27/2017 11:33:17 PM       Radiology Dg Chest 2 View  Result Date: 05/27/2017 CLINICAL DATA:  Chest pain after fall. EXAM: CHEST  2 VIEW COMPARISON:  10/18/2016 FINDINGS: Stable cardiomegaly with mild uncoiling of the thoracic aorta. There is aortic  atherosclerosis. Mild pulmonary vascular congestion is noted without pneumonic consolidation. Minimal atelectasis is seen at the lung bases. Trace left effusion. No pneumothorax. No acute fracture of the bony thorax. High-riding humeral heads compatible with chronic rotator cuff tears, stable in appearance. Osteoarthritis of the [facility] and glenohumeral joints. IMPRESSION: 1. Stable cardiomegaly with mild pulmonary vascular congestion. 2. Bibasilar atelectasis. 3. Aortic atherosclerosis. 4. No acute osseous abnormality Electronically Signed   By: Ashley Royalty M.D.   On: 05/27/2017 22:06    Procedures Procedures (including critical care time)  Medications Ordered in ED Medications  acetaminophen (TYLENOL) tablet 1,000 mg (not administered)  traMADol (ULTRAM) tablet 50 mg (not administered)     Initial Impression / Assessment and Plan / ED Course  I have reviewed the triage vital signs and the nursing notes.  Pertinent labs & imaging results that were available during my care of the patient were  reviewed by me and considered in my medical decision making (see chart for details).      Final Clinical Impressions(s) / ED Diagnoses   Final diagnoses:  Fall, initial encounter  Chest wall pain   Patient had mechanical fall.  She is alert and appropriate.  No head injury.  Patient's only area of pain complaint is her left anterior chest.  This is reproducible to compression.  No associated dyspnea.  Chest x-ray does not show acute fracture or pulmonary anomaly.  Patient be treated for pain with acetaminophen and tramadol.  She is with family members to assist in caregiving. ED Discharge Orders        Ordered    acetaminophen (TYLENOL) 500 MG tablet  Every 6 hours PRN     05/27/17 2337    traMADol (ULTRAM) 50 MG tablet     05/27/17 2337       Charlesetta Shanks, MD 05/27/17 2343

## 2017-05-27 NOTE — ED Notes (Signed)
Provider at bedside

## 2017-05-27 NOTE — ED Triage Notes (Signed)
Patient arrived via [facility] from [facility] for chest pain. Patient fail earlier this afternoon and that is when the pain starter at the base of her neck below collar bones. Increased pain with range of motion, palpation, and movement. Chest ain got a little worse when palpated in left axillary. Right axillary palpated was pin point pain on the ribs. Patient fail backward and landed flat on her back on tile floor. Denies LOC.

## 2017-05-28 DIAGNOSIS — E039 Hypothyroidism, unspecified: Secondary | ICD-10-CM | POA: Diagnosis present

## 2017-05-28 DIAGNOSIS — S32511A Fracture of superior rim of right pubis, initial encounter for closed fracture: Secondary | ICD-10-CM | POA: Diagnosis present

## 2017-05-28 DIAGNOSIS — N179 Acute kidney failure, unspecified: Secondary | ICD-10-CM | POA: Diagnosis present

## 2017-05-28 DIAGNOSIS — F411 Generalized anxiety disorder: Secondary | ICD-10-CM | POA: Diagnosis present

## 2017-05-28 DIAGNOSIS — I129 Hypertensive chronic kidney disease with stage 1 through stage 4 chronic kidney disease, or unspecified chronic kidney disease: Secondary | ICD-10-CM | POA: Diagnosis present

## 2017-05-28 DIAGNOSIS — Z7982 Long term (current) use of aspirin: Secondary | ICD-10-CM

## 2017-05-28 DIAGNOSIS — W1830XA Fall on same level, unspecified, initial encounter: Secondary | ICD-10-CM | POA: Diagnosis present

## 2017-05-28 DIAGNOSIS — Z9842 Cataract extraction status, left eye: Secondary | ICD-10-CM

## 2017-05-28 DIAGNOSIS — Z9012 Acquired absence of left breast and nipple: Secondary | ICD-10-CM

## 2017-05-28 DIAGNOSIS — N183 Chronic kidney disease, stage 3 (moderate): Secondary | ICD-10-CM | POA: Diagnosis present

## 2017-05-28 DIAGNOSIS — B9620 Unspecified Escherichia coli [E. coli] as the cause of diseases classified elsewhere: Secondary | ICD-10-CM | POA: Diagnosis present

## 2017-05-28 DIAGNOSIS — S32591A Other specified fracture of right pubis, initial encounter for closed fracture: Secondary | ICD-10-CM | POA: Diagnosis present

## 2017-05-28 DIAGNOSIS — N390 Urinary tract infection, site not specified: Principal | ICD-10-CM | POA: Diagnosis present

## 2017-05-28 DIAGNOSIS — Z9841 Cataract extraction status, right eye: Secondary | ICD-10-CM

## 2017-05-28 DIAGNOSIS — Z79899 Other long term (current) drug therapy: Secondary | ICD-10-CM

## 2017-05-28 DIAGNOSIS — Z853 Personal history of malignant neoplasm of breast: Secondary | ICD-10-CM

## 2017-05-28 DIAGNOSIS — Z66 Do not resuscitate: Secondary | ICD-10-CM | POA: Diagnosis present

## 2017-05-28 DIAGNOSIS — E876 Hypokalemia: Secondary | ICD-10-CM | POA: Diagnosis present

## 2017-05-28 DIAGNOSIS — Z888 Allergy status to other drugs, medicaments and biological substances status: Secondary | ICD-10-CM

## 2017-05-28 DIAGNOSIS — Z79890 Hormone replacement therapy (postmenopausal): Secondary | ICD-10-CM

## 2017-05-28 DIAGNOSIS — E860 Dehydration: Secondary | ICD-10-CM | POA: Diagnosis present

## 2017-05-28 NOTE — ED Notes (Signed)
Assisted patient to the restroom with a wheelchair and provided scrub bottoms due to urinating on them. Pt tolerated it fair.

## 2017-05-30 ENCOUNTER — Encounter (INDEPENDENT_AMBULATORY_CARE_PROVIDER_SITE_OTHER): Payer: Self-pay | Admitting: Family

## 2017-05-30 ENCOUNTER — Ambulatory Visit (INDEPENDENT_AMBULATORY_CARE_PROVIDER_SITE_OTHER): Payer: Medicare Other | Admitting: Nurse Practitioner

## 2017-05-30 ENCOUNTER — Encounter: Payer: Self-pay | Admitting: Nurse Practitioner

## 2017-05-30 ENCOUNTER — Other Ambulatory Visit: Payer: Self-pay | Admitting: Nurse Practitioner

## 2017-05-30 ENCOUNTER — Ambulatory Visit
Admission: RE | Admit: 2017-05-30 | Discharge: 2017-05-30 | Disposition: A | Discharge status: Home / Self Care | Payer: Medicare Other | Source: Ambulatory Visit | Attending: Nurse Practitioner | Admitting: Nurse Practitioner

## 2017-05-30 ENCOUNTER — Ambulatory Visit (INDEPENDENT_AMBULATORY_CARE_PROVIDER_SITE_OTHER): Payer: Medicare Other | Admitting: Family

## 2017-05-30 ENCOUNTER — Telehealth: Payer: Self-pay | Admitting: *Deleted

## 2017-05-30 VITALS — BP 140/70 | HR 68 | Temp 98.0°F

## 2017-05-30 DIAGNOSIS — S32010A Wedge compression fracture of first lumbar vertebra, initial encounter for closed fracture: Secondary | ICD-10-CM | POA: Diagnosis not present

## 2017-05-30 DIAGNOSIS — R413 Other amnesia: Secondary | ICD-10-CM

## 2017-05-30 DIAGNOSIS — R531 Weakness: Secondary | ICD-10-CM | POA: Diagnosis not present

## 2017-05-30 DIAGNOSIS — N182 Chronic kidney disease, stage 2 (mild): Secondary | ICD-10-CM

## 2017-05-30 DIAGNOSIS — I10 Essential (primary) hypertension: Secondary | ICD-10-CM | POA: Diagnosis not present

## 2017-05-30 DIAGNOSIS — S3992XA Unspecified injury of lower back, initial encounter: Secondary | ICD-10-CM | POA: Diagnosis not present

## 2017-05-30 DIAGNOSIS — M4316 Spondylolisthesis, lumbar region: Secondary | ICD-10-CM | POA: Diagnosis not present

## 2017-05-30 DIAGNOSIS — W19XXXD Unspecified fall, subsequent encounter: Secondary | ICD-10-CM

## 2017-05-30 DIAGNOSIS — R3981 Functional urinary incontinence: Secondary | ICD-10-CM

## 2017-05-30 DIAGNOSIS — S32591A Other specified fracture of right pubis, initial encounter for closed fracture: Secondary | ICD-10-CM | POA: Diagnosis not present

## 2017-05-30 DIAGNOSIS — S32511A Fracture of superior rim of right pubis, initial encounter for closed fracture: Secondary | ICD-10-CM | POA: Diagnosis not present

## 2017-05-30 DIAGNOSIS — E039 Hypothyroidism, unspecified: Secondary | ICD-10-CM | POA: Diagnosis not present

## 2017-05-30 DIAGNOSIS — M170 Bilateral primary osteoarthritis of knee: Secondary | ICD-10-CM | POA: Diagnosis not present

## 2017-05-30 DIAGNOSIS — S32810A Multiple fractures of pelvis with stable disruption of pelvic ring, initial encounter for closed fracture: Secondary | ICD-10-CM

## 2017-05-30 LAB — BASIC METABOLIC PANEL WITH GFR
BUN/Creatinine Ratio: 16 (calc) (ref 6–22)
BUN: 32 mg/dL — ABNORMAL HIGH (ref 7–25)
CALCIUM: 9 mg/dL (ref 8.6–10.4)
CO2: 25 mmol/L (ref 20–32)
Chloride: 99 mmol/L (ref 98–110)
Creat: 2.01 mg/dL — ABNORMAL HIGH (ref 0.60–0.88)
GFR, Est African American: 23 mL/min/{1.73_m2} — ABNORMAL LOW (ref >=60)
GFR, Est Non African American: 20 mL/min/{1.73_m2} — ABNORMAL LOW (ref >=60)
Glucose, Bld: 116 mg/dL — ABNORMAL HIGH (ref 65–99)
POTASSIUM: 4.2 mmol/L (ref 3.5–5.3)
Sodium: 136 mmol/L (ref 135–146)

## 2017-05-30 LAB — CBC WITH DIFFERENTIAL/PLATELET
BASOS PCT: 0.9 %
Basophils Absolute: 86 cells/uL (ref 0–200)
Eosinophils Absolute: 250 cells/uL (ref 15–500)
Eosinophils Relative: 2.6 %
HCT: 35.8 % (ref 35.0–45.0)
HEMOGLOBIN: 12.2 g/dL (ref 11.7–15.5)
Lymphs Abs: 912 cells/uL (ref 850–3900)
MCH: 31.4 pg (ref 27.0–33.0)
MCHC: 34.1 g/dL (ref 32.0–36.0)
MCV: 92 fL (ref 80.0–100.0)
MONOS PCT: 10.2 %
MPV: 10.9 fL (ref 7.5–12.5)
NEUTROS ABS: 7373 {cells}/uL (ref 1500–7800)
Neutrophils Relative %: 76.8 %
Platelets: 186 10*3/uL (ref 140–400)
RBC: 3.89 10*6/uL (ref 3.80–5.10)
RDW: 13 % (ref 11.0–15.0)
Total Lymphocyte: 9.5 %
WBC mixed population: 979 cells/uL — ABNORMAL HIGH (ref 200–950)
WBC: 9.6 10*3/uL (ref 3.8–10.8)

## 2017-05-30 LAB — TSH: TSH: 12.98 m[IU]/L — AB (ref 0.40–4.50)

## 2017-05-30 MED ORDER — TRAMADOL HCL 50 MG PO TABS: ORAL_TABLET | ORAL | 0 refills | Status: DC

## 2017-05-30 NOTE — Progress Notes (Signed)
Careteam: Patient Care Team: Gayland Curry, DO as PCP - General (Geriatric Medicine) [name], MD as Consulting Physician (Orthopedic Surgery) [name], MD as Attending Physician (General Surgery) [name], MD as Consulting Physician (Ophthalmology)  Advanced Directive information    Allergies  Allergen Reactions  . Macrobid [Nitrofurantoin Macrocrystal] Other (See Comments)    unknown    Chief Complaint  Patient presents with  . Acute Visit    follow-up from ER, discuss FL-2     HPI: Patient is a 84 y.o. female seen in the office today for follow up ED visit for fall.  Pt lives alone at home and has had frequent falls. family requesting FL-2 to be complete for AL because they need help.  Family feels like she is gradually getting worse since her ED visit and mobility has completely gone. Very quick progression of weakness since her fall.  More pain everywhere since fall. Xray of chest negative.  She was sent to ED via EMS. She has not been able to walk since fall. Prior to fall she was mobile with walker. There was no other imagining or labs done in ED.  Daughter in law states she will not even put one foot in front of another. Very foul smelling urine but this is not new.  No dysuria Pt is incontinent of bladder which is not new but feels like more accidents with bowel lately.  No abdominal pain.     Review of Systems:  Review of Systems  Constitutional: Positive for malaise/fatigue. Negative for chills and fever.  HENT: Positive for hearing loss.   Eyes:       Dry eyes, glasses  Respiratory: Negative for shortness of breath.   Cardiovascular: Negative for chest pain, palpitations and leg swelling.  Gastrointestinal: Negative for abdominal pain.  Genitourinary: Negative for dysuria, frequency and urgency.       Urinary incontinence  Musculoskeletal: Positive for falls and myalgias.       Reports she is hurting all over  Skin: Negative  for itching and rash.  Neurological: Positive for weakness (worse). Negative for dizziness and loss of consciousness.  Endo/Heme/Allergies:       Hyperglycemia  Psychiatric/Behavioral: Positive for memory loss. Negative for depression. The patient is not nervous/anxious and does not have insomnia.     Past Medical History:  Diagnosis Date  . Anxiety state, unspecified   . Dysthymic disorder   . First degree atrioventricular block   . Hyperglycemia 01/30/09  . Malignant neoplasm of breast (female), unspecified site   . Osteoarthrosis, unspecified whether generalized or localized, unspecified site   . Senile osteoporosis   . Trigger finger (acquired)   . Unspecified constipation   . Unspecified essential hypertension   . Unspecified hypothyroidism   . Unspecified vitamin D deficiency    Past Surgical History:  Procedure Laterality Date  . CATARACT EXTRACTION BILATERAL W/ ANTERIOR VITRECTOMY Bilateral 2002  . MASTECTOMY Left 1986   Dr.Weatherly   . ORIF FACIAL FRACTURE  1962   Social History:   reports that  has never smoked. she has never used smokeless tobacco. She reports that she does not drink alcohol or use drugs.  Family History  Problem Relation Age of Onset  . Cerebrovascular Accident Mother   . Hypertension Mother   . Cerebrovascular Accident Father   . Heart disease Sister   . Emphysema Brother     Medications: Patient's Medications  New Prescriptions   No medications on file  Previous Medications   ACETAMINOPHEN (TYLENOL) 500 MG TABLET    Take 2 tablets (1,000 mg total) by mouth every 6 (six) hours as needed.   AMLODIPINE-BENAZEPRIL (LOTREL) 10-20 MG CAPSULE    TAKE 1 CAPSULE BY MOUTH DAILY   ASPIRIN EC 81 MG TABLET    Take 1 tablet (81 mg total) by mouth daily.   CHLORDIAZEPOXIDE (LIBRIUM) 10 MG CAPSULE    Take one capsule daily as needed for rest or nerves   HYDRALAZINE (APRESOLINE) 25 MG TABLET    Take 1 tablet (25 mg total) by mouth daily.    LEVOTHYROXINE (SYNTHROID, LEVOTHROID) 100 MCG TABLET    Take 1 tablet (100 mcg total) by mouth daily.   METOPROLOL SUCCINATE (TOPROL-XL) 50 MG 24 HR TABLET    TAKE 1 TABLET BY MOUTH DAILY TO CONTROL BLOOD PRESSURE AND HEART RHYTHM.(TAKE WITH OR IMMEDIATELY FOLLOWING A MEAL)   TRAMADOL (ULTRAM) 50 MG TABLET    2 tablets every 6 hours as needed for pain.  You may take with Tylenol.  Modified Medications   No medications on file  Discontinued Medications   No medications on file     Physical Exam:  Vitals:   05/30/17 0850  BP: 140/70  Pulse: 68  Temp: 98 F (36.7 C)  TempSrc: Oral  SpO2: 94%   There is no height or weight on file to calculate BMI.  Physical Exam  Constitutional: She appears well-developed and well-nourished. No distress.  HENT:  Head: Normocephalic and atraumatic.  hearing aids  Eyes:  glasses  Cardiovascular: Normal rate, regular rhythm, normal heart sounds and intact distal pulses.  Pulmonary/Chest: Effort normal and breath sounds normal. No respiratory distress.  Abdominal: Bowel sounds are normal.  Musculoskeletal:       Right hip: She exhibits decreased range of motion.       Left hip: She exhibits decreased range of motion.       Right knee: She exhibits decreased range of motion. Tenderness found.       Left knee: She exhibits decreased range of motion and swelling. Tenderness found.       Lumbar back: She exhibits decreased range of motion and tenderness.  Yells out with any movement. Yelling everything hurts  Stands with assistance with step fwd with right leg but not with left. Limited exam due to tolerability.   Neurological: She is alert.  Pleasantly confused, not a good historian, but son and daughter in law helped with history  Skin: Skin is warm and dry. Capillary refill takes less than 2 seconds.  Psychiatric: She has a normal mood and affect.   Labs reviewed: Basic Metabolic Panel: Recent Labs    08/14/16 0913 10/18/16 2247  10/19/16 0020 11/18/16 1442 02/17/17 0845  NA 140 137  --  140 140  K 3.9 2.9*  --  4.3 3.9  CL 104 102  --  105 105  CO2 27 24  --  21 27  GLUCOSE 92 173*  --  102* 95  BUN 24 23*  --  25 28*  CREATININE 1.65* 1.58*  --  1.58* 1.98*  CALCIUM 9.3 8.8*  --  9.3 9.3  TSH 4.48  --  5.963*  --  4.68*   Liver Function Tests: Recent Labs    11/18/16 1442  AST 19  ALT 8  ALKPHOS 82  BILITOT 0.4  PROT 7.1  ALBUMIN 3.9   No results for input(s): LIPASE, AMYLASE in the last 8760 hours. No results for  input(s): AMMONIA in the last 8760 hours. CBC: Recent Labs    10/18/16 2247  WBC 6.5  NEUTROABS 3.8  HGB 11.5*  HCT 35.1*  MCV 95.6  PLT 183   Lipid Panel: No results for input(s): CHOL, HDL, LDLCALC, TRIG, CHOLHDL, LDLDIRECT in the last 8760 hours. TSH: Recent Labs    08/14/16 0913 10/19/16 0020 02/17/17 0845  TSH 4.48 5.963* 4.68*   A1C: Lab Results  Component Value Date   HGBA1C 5.6 08/14/2016     Assessment/Plan 1. Fall, subsequent encounter Went to the ED after fall but there was not workup for LE and she has not been able to walk since fall. She does stand and pivot with assistance. No major swelling or bruising noted.  - DG Lumbar Spine Complete; Future - DG Pelvis 1-2 Views; Future - traMADol (ULTRAM) 50 MG tablet; 2 tablets every 6 hours as needed for pain.  You may take with Tylenol.  Dispense: 30 tablet; Refill: 0  2. Weakness Significant weakness since fall. Can no long stay at home. FL2 form completed and recommended SNF with rehab at this time.   3. Chronic kidney disease (CKD) stage G2/A2, mildly decreased glomerular filtration rate (GFR) between 60-89 mL/min/1.73 square meter and albuminuria creatinine ratio between 30-299 mg/g Will follow up labs today  4. Memory impairment Has been stable and doing well living alone with help coming in during the week until recently and now needing increase assistance  5. Essential hypertension Stable on  current regimen - BMP with eGFR - CBC with Differential/Platelets  6. Hypothyroidism, unspecified type -will follow up labs, currently on synthroid daily - TSH  7. Primary osteoarthritis of both knees Increase pain in bilateral knees since fall. Using tramadol PRN  8. Functional urinary incontinence -wearing pad, with foul odor and poor hygiene per family, question UTI but unable to urinate in toilet, no abdominal discomfort or dysuria or fever. Encouraged to increase hydration and change pad more frequently.  Carlos American. Harle Battiest  [facility] [phone] 8 am - 5 pm) [phone] (after hours)

## 2017-05-30 NOTE — Progress Notes (Signed)
Office Visit Note   Patient: Diana Anthony           Date of Birth: 04-May-1917           MRN: [id number] Visit Date: 05/30/2017              Requested by: Gayland Curry, DO [place], [place] [address] PCP: Gayland Curry, DO  Chief Complaint  Patient presents with  . Right Hip - Fracture      HPI: The patient is a 84 year old woman seen today as a work in for right superior and inferior pubic rami fractures. Had a fall 4 days ago from standing level. Fell directly on to her buttocks. Initially was able to ambulate. Over last several days has had progressively worse pain and inability to bear weight on the right. No new back pain. No radicular symptoms.  Was seen at PCP earlier today. Sherrie Mustache NP has filed FL2 and is working with family to get her in to [facility]. Family feels she will require skilled nursing rather than assisted.   Today in wheelchair for mobility. Did have radiographs earlier today which were revealing for inferior and superior pubic rami fractures on right. Does also have spondylolisthesis of lumbar spine with concern for compression fractures of thoracic and L1 spine, no new back pain, numbness, tingling or weakness. No red flag symptoms.  Assessment & Plan: Visit Diagnoses:  1. Closed fracture of superior ramus of right pubis, initial encounter ([place])   2. Closed fracture of right inferior pubic ramus, initial encounter ([place])   3. Closed compression fracture of first lumbar vertebra, initial encounter ([place])   4. Spondylolisthesis, lumbar region     Plan: May weight bear on left to pivot and transfer. nonweight bearing on right. Follow up in office with Dr. Marlou Sa with repeat radiographs of pelvis. Discussed compression fracture of lumbar spine, L1 likely old.  Thus far using Tylenol with minimal relief of pain. Have Rx for Tramadol, have not yet picked this up. Will use over weekend for pain. May escalate pain control if necessary.   Follow-Up  Instructions: Return in about 2 weeks (around 06/13/2017) for c dean.   Right Hip Exam   Tenderness  The patient is experiencing tenderness in the anterior and lateral.  Other  Sensation: normal   Back Exam   Tenderness  The patient is experiencing tenderness in the lumbar and thoracic.  Comments:  Straight leg raises and gait testing deferred      Patient is alert, oriented, no adenopathy, well-dressed, normal affect, normal respiratory effort.   Imaging: Dg Lumbar Spine Complete  Result Date: 05/30/2017 CLINICAL DATA:  Fall. EXAM: LUMBAR SPINE - COMPLETE 4+ VIEW COMPARISON:  Chest x-ray 05/27/2017. FINDINGS: Soft tissue structures are unremarkable. Stool noted throughout the colon. No bowel distention. Osteopenia. Mild scoliosis lumbar spine. Degenerative changes lumbar spine and both hips. 9 mm anterolisthesis L4 on L5. Minimal L1 compression. Mild lower thoracic vertebral body compressions cannot be excluded. IMPRESSION: 1. Osteopenia and mild scoliosis lumbar spine. Diffuse osteopenia multilevel degenerative change. 9 mm anterolisthesis L4 on L5. 2. Mild L1 compression cannot be excluded. Multiple mild lower thoracic vertebral body compressions cannot be excluded. These changes may be old. Electronically Signed   By: Marcello Moores  Register   On: 05/30/2017 11:01   Dg Pelvis 1-2 Views  Result Date: 05/30/2017 CLINICAL DATA:  Fall 3 days ago, pain EXAM: PELVIS - 1-2 VIEW COMPARISON:  None. FINDINGS:  Fractures are noted through the inferior and superior pubic rami on the right. The superior pubic ramus fracture is near the ischium and medial acetabulum. No proximal femoral fracture. No subluxation or dislocation. IMPRESSION: Right superior and inferior pubic rami fractures. Electronically Signed   By: Rolm Baptise M.D.   On: 05/30/2017 10:58   No images are attached to the encounter.  Labs: Lab Results  Component Value Date   HGBA1C 5.6 08/14/2016   HGBA1C 5.9 (H) 02/19/2016    HGBA1C 5.9 (H) 08/07/2015   REPTSTATUS 10/21/2016 FINAL 10/19/2016   CULT >=100,000 COLONIES/mL ESCHERICHIA COLI (A) 10/19/2016   LABORGA ESCHERICHIA COLI (A) 10/19/2016    @LABSALLVALUES (HGBA1)@  There is no height or weight on file to calculate BMI.  Orders:  No orders of the defined types were placed in this encounter.  No orders of the defined types were placed in this encounter.    Procedures: No procedures performed  Clinical Data: No additional findings.  ROS:  All other systems negative, except as noted in the HPI. Review of Systems  Musculoskeletal: Positive for arthralgias and gait problem. Negative for back pain.  Neurological: Negative for weakness and numbness.    Objective: Vital Signs: There were no vitals taken for this visit.  Specialty Comments:  No specialty comments available.  PMFS History: Patient Active Problem List   Diagnosis Date Noted  . Closed fracture of right superior rim of pubis ([place]) 05/30/2017  . Closed fracture of right inferior pubic ramus ([place]) 05/30/2017  . Compression fracture of L1 lumbar vertebra ([facility]) 05/30/2017  . Spondylolisthesis, lumbar region 05/30/2017  . Chronic kidney disease (CKD) stage G2/A2, mildly decreased glomerular filtration rate (GFR) between 60-89 mL/min/1.73 square meter and albuminuria creatinine ratio between 30-299 mg/g 01/31/2015  . Memory impairment 01/31/2015  . Osteoarthritis of right knee 02/24/2014  . Urinary tract infection, site not specified 12/16/2012  . Unspecified vitamin D deficiency   . Anxiety state   . History of breast cancer in female   . Hypothyroidism   . Essential hypertension   . Hyperglycemia 01/30/2009   Past Medical History:  Diagnosis Date  . Anxiety state, unspecified   . Dysthymic disorder   . First degree atrioventricular block   . Hyperglycemia 01/30/09  . Malignant neoplasm of breast (female), unspecified site   . Osteoarthrosis, unspecified whether generalized or  localized, unspecified site   . Senile osteoporosis   . Trigger finger (acquired)   . Unspecified constipation   . Unspecified essential hypertension   . Unspecified hypothyroidism   . Unspecified vitamin D deficiency     Family History  Problem Relation Age of Onset  . Cerebrovascular Accident Mother   . Hypertension Mother   . Cerebrovascular Accident Father   . Heart disease Sister   . Emphysema Brother     Past Surgical History:  Procedure Laterality Date  . CATARACT EXTRACTION BILATERAL W/ ANTERIOR VITRECTOMY Bilateral 2002  . MASTECTOMY Left 1986   Dr.Weatherly   . ORIF FACIAL FRACTURE  1962   Social History   Occupational History  . Not on file  Tobacco Use  . Smoking status: Never Smoker  . Smokeless tobacco: Never Used  Substance and Sexual Activity  . Alcohol use: No    Alcohol/week: 0.0 oz  . Drug use: No  . Sexual activity: No

## 2017-05-30 NOTE — Telephone Encounter (Signed)
Debbie with [facility] called with a Call Report on patient's Pelvic X-Ray:Right Superior and inferior pubic rami fractures.  Lumbar XRay-Possible L1 Compression Fracture.   Spoke with Janett Billow and she reviewed X-Rays and spoke with Dr. Mariea Clonts.  Wants to refer patient ASAP to an Orthopaedic.   I called [facility] [phone] and scheduled an appointment for today 05/30/17 @ 1:45  Called and informed Caregiver and she agreed. She will have patient there at 1:45  [place].

## 2017-05-30 NOTE — Patient Instructions (Addendum)
To go to [facility] for xrays of lumbar spine and hip

## 2017-05-31 ENCOUNTER — Inpatient Hospital Stay (HOSPITAL_COMMUNITY)
Admission: EM | Admit: 2017-05-31 | Discharge: 2017-06-03 | DRG: 690 | Disposition: A | Discharge status: Skilled Nursing Facility | Payer: Medicare Other | Attending: Family Medicine | Admitting: Family Medicine

## 2017-05-31 ENCOUNTER — Encounter (HOSPITAL_COMMUNITY): Payer: Self-pay | Admitting: Emergency Medicine

## 2017-05-31 ENCOUNTER — Emergency Department (HOSPITAL_COMMUNITY): Payer: Medicare Other

## 2017-05-31 ENCOUNTER — Other Ambulatory Visit: Payer: Self-pay

## 2017-05-31 DIAGNOSIS — W1830XA Fall on same level, unspecified, initial encounter: Secondary | ICD-10-CM | POA: Diagnosis present

## 2017-05-31 DIAGNOSIS — M6281 Muscle weakness (generalized): Secondary | ICD-10-CM | POA: Diagnosis not present

## 2017-05-31 DIAGNOSIS — N10 Acute tubulo-interstitial nephritis: Secondary | ICD-10-CM

## 2017-05-31 DIAGNOSIS — W19XXXA Unspecified fall, initial encounter: Secondary | ICD-10-CM

## 2017-05-31 DIAGNOSIS — I129 Hypertensive chronic kidney disease with stage 1 through stage 4 chronic kidney disease, or unspecified chronic kidney disease: Secondary | ICD-10-CM | POA: Diagnosis present

## 2017-05-31 DIAGNOSIS — Z888 Allergy status to other drugs, medicaments and biological substances status: Secondary | ICD-10-CM | POA: Diagnosis not present

## 2017-05-31 DIAGNOSIS — F411 Generalized anxiety disorder: Secondary | ICD-10-CM | POA: Diagnosis present

## 2017-05-31 DIAGNOSIS — Z4789 Encounter for other orthopedic aftercare: Secondary | ICD-10-CM | POA: Diagnosis not present

## 2017-05-31 DIAGNOSIS — S32511D Fracture of superior rim of right pubis, subsequent encounter for fracture with routine healing: Secondary | ICD-10-CM | POA: Diagnosis not present

## 2017-05-31 DIAGNOSIS — T148XXA Other injury of unspecified body region, initial encounter: Secondary | ICD-10-CM | POA: Diagnosis not present

## 2017-05-31 DIAGNOSIS — B9620 Unspecified Escherichia coli [E. coli] as the cause of diseases classified elsewhere: Secondary | ICD-10-CM | POA: Diagnosis not present

## 2017-05-31 DIAGNOSIS — W19XXXD Unspecified fall, subsequent encounter: Secondary | ICD-10-CM

## 2017-05-31 DIAGNOSIS — M79604 Pain in right leg: Secondary | ICD-10-CM | POA: Diagnosis not present

## 2017-05-31 DIAGNOSIS — Z79890 Hormone replacement therapy (postmenopausal): Secondary | ICD-10-CM | POA: Diagnosis not present

## 2017-05-31 DIAGNOSIS — S32511A Fracture of superior rim of right pubis, initial encounter for closed fracture: Secondary | ICD-10-CM | POA: Diagnosis present

## 2017-05-31 DIAGNOSIS — E860 Dehydration: Secondary | ICD-10-CM | POA: Diagnosis present

## 2017-05-31 DIAGNOSIS — I10 Essential (primary) hypertension: Secondary | ICD-10-CM | POA: Diagnosis not present

## 2017-05-31 DIAGNOSIS — R41841 Cognitive communication deficit: Secondary | ICD-10-CM | POA: Diagnosis not present

## 2017-05-31 DIAGNOSIS — Z9841 Cataract extraction status, right eye: Secondary | ICD-10-CM | POA: Diagnosis not present

## 2017-05-31 DIAGNOSIS — M25551 Pain in right hip: Secondary | ICD-10-CM | POA: Diagnosis not present

## 2017-05-31 DIAGNOSIS — S32591S Other specified fracture of right pubis, sequela: Secondary | ICD-10-CM

## 2017-05-31 DIAGNOSIS — N182 Chronic kidney disease, stage 2 (mild): Secondary | ICD-10-CM | POA: Diagnosis present

## 2017-05-31 DIAGNOSIS — S92153A Displaced avulsion fracture (chip fracture) of unspecified talus, initial encounter for closed fracture: Secondary | ICD-10-CM | POA: Diagnosis not present

## 2017-05-31 DIAGNOSIS — Z9842 Cataract extraction status, left eye: Secondary | ICD-10-CM | POA: Diagnosis not present

## 2017-05-31 DIAGNOSIS — N390 Urinary tract infection, site not specified: Secondary | ICD-10-CM | POA: Diagnosis present

## 2017-05-31 DIAGNOSIS — Z9012 Acquired absence of left breast and nipple: Secondary | ICD-10-CM | POA: Diagnosis not present

## 2017-05-31 DIAGNOSIS — R278 Other lack of coordination: Secondary | ICD-10-CM | POA: Diagnosis not present

## 2017-05-31 DIAGNOSIS — E876 Hypokalemia: Secondary | ICD-10-CM | POA: Diagnosis present

## 2017-05-31 DIAGNOSIS — S32501S Unspecified fracture of right pubis, sequela: Secondary | ICD-10-CM | POA: Diagnosis not present

## 2017-05-31 DIAGNOSIS — N179 Acute kidney failure, unspecified: Secondary | ICD-10-CM | POA: Diagnosis not present

## 2017-05-31 DIAGNOSIS — R2681 Unsteadiness on feet: Secondary | ICD-10-CM | POA: Diagnosis not present

## 2017-05-31 DIAGNOSIS — N183 Chronic kidney disease, stage 3 (moderate): Secondary | ICD-10-CM | POA: Diagnosis present

## 2017-05-31 DIAGNOSIS — Z7982 Long term (current) use of aspirin: Secondary | ICD-10-CM | POA: Diagnosis not present

## 2017-05-31 DIAGNOSIS — E039 Hypothyroidism, unspecified: Secondary | ICD-10-CM | POA: Diagnosis present

## 2017-05-31 DIAGNOSIS — Z79899 Other long term (current) drug therapy: Secondary | ICD-10-CM | POA: Diagnosis not present

## 2017-05-31 DIAGNOSIS — S32591A Other specified fracture of right pubis, initial encounter for closed fracture: Secondary | ICD-10-CM | POA: Diagnosis present

## 2017-05-31 DIAGNOSIS — S32591D Other specified fracture of right pubis, subsequent encounter for fracture with routine healing: Secondary | ICD-10-CM | POA: Diagnosis not present

## 2017-05-31 DIAGNOSIS — I517 Cardiomegaly: Secondary | ICD-10-CM | POA: Diagnosis not present

## 2017-05-31 DIAGNOSIS — Z66 Do not resuscitate: Secondary | ICD-10-CM | POA: Diagnosis present

## 2017-05-31 DIAGNOSIS — S299XXA Unspecified injury of thorax, initial encounter: Secondary | ICD-10-CM | POA: Diagnosis not present

## 2017-05-31 DIAGNOSIS — G8911 Acute pain due to trauma: Secondary | ICD-10-CM | POA: Diagnosis not present

## 2017-05-31 DIAGNOSIS — Z853 Personal history of malignant neoplasm of breast: Secondary | ICD-10-CM | POA: Diagnosis not present

## 2017-05-31 LAB — COMPREHENSIVE METABOLIC PANEL
ALT: 22 U/L (ref 14–54)
AST: 49 U/L — AB (ref 15–41)
Albumin: 3.1 g/dL — ABNORMAL LOW (ref 3.5–5.0)
Alkaline Phosphatase: 107 U/L (ref 38–126)
Anion gap: 12 (ref 5–15)
BILIRUBIN TOTAL: 0.8 mg/dL (ref 0.3–1.2)
BUN: 34 mg/dL — ABNORMAL HIGH (ref 6–20)
CALCIUM: 8.7 mg/dL — AB (ref 8.9–10.3)
CO2: 22 mmol/L (ref 22–32)
Chloride: 102 mmol/L (ref 101–111)
Creatinine, Ser: 2.1 mg/dL — ABNORMAL HIGH (ref 0.44–1.00)
GFR calc Af Amer: 21 mL/min — ABNORMAL LOW (ref >60)
GFR, EST NON AFRICAN AMERICAN: 18 mL/min — AB (ref >60)
Glucose, Bld: 136 mg/dL — ABNORMAL HIGH (ref 65–99)
POTASSIUM: 3.2 mmol/L — AB (ref 3.5–5.1)
Sodium: 136 mmol/L (ref 135–145)
TOTAL PROTEIN: 6.8 g/dL (ref 6.5–8.1)

## 2017-05-31 LAB — URINALYSIS, ROUTINE W REFLEX MICROSCOPIC
Bilirubin Urine: NEGATIVE
Glucose, UA: NEGATIVE mg/dL
Ketones, ur: NEGATIVE mg/dL
NITRITE: NEGATIVE
Protein, ur: 100 mg/dL — AB
SPECIFIC GRAVITY, URINE: 1.016 (ref 1.005–1.030)
SQUAMOUS EPITHELIAL / LPF: NONE SEEN
pH: 5 (ref 5.0–8.0)

## 2017-05-31 LAB — CBC WITH DIFFERENTIAL/PLATELET
Basophils Absolute: 0 10*3/uL (ref 0.0–0.1)
Basophils Relative: 0 %
Eosinophils Absolute: 0.1 10*3/uL (ref 0.0–0.7)
Eosinophils Relative: 1 %
HEMATOCRIT: 36.6 % (ref 36.0–46.0)
Hemoglobin: 12.1 g/dL (ref 12.0–15.0)
LYMPHS PCT: 10 %
Lymphs Abs: 0.8 10*3/uL (ref 0.7–4.0)
MCH: 31.3 pg (ref 26.0–34.0)
MCHC: 33.1 g/dL (ref 30.0–36.0)
MCV: 94.6 fL (ref 78.0–100.0)
MONO ABS: 1 10*3/uL (ref 0.1–1.0)
MONOS PCT: 12 %
NEUTROS ABS: 6.6 10*3/uL (ref 1.7–7.7)
Neutrophils Relative %: 77 %
Platelets: 192 10*3/uL (ref 150–400)
RBC: 3.87 MIL/uL (ref 3.87–5.11)
RDW: 14.1 % (ref 11.5–15.5)
WBC: 8.5 10*3/uL (ref 4.0–10.5)

## 2017-05-31 LAB — I-STAT TROPONIN, ED: TROPONIN I, POC: 0.06 ng/mL (ref 0.00–0.08)

## 2017-05-31 MED ORDER — ACETAMINOPHEN 500 MG PO TABS: 1000.0000 mg | ORAL_TABLET | Freq: Four times a day (QID) | ORAL | Status: DC | PRN

## 2017-05-31 MED ORDER — LEVOTHYROXINE SODIUM 100 MCG PO TABS
100.0000 ug | ORAL_TABLET | Freq: Every day | ORAL | Status: DC
  Administered 2017-06-01 – 2017-06-03 (×3): 100 ug via ORAL
  Filled 2017-05-31 (×3): qty 1

## 2017-05-31 MED ORDER — DEXTROSE 5 % IV SOLN
1.0000 g | Freq: Once | INTRAVENOUS | Status: AC
  Administered 2017-05-31: 1 g via INTRAVENOUS
  Filled 2017-05-31: qty 10

## 2017-05-31 MED ORDER — SODIUM CHLORIDE 0.45 % IV SOLN
INTRAVENOUS | Status: DC
  Administered 2017-05-31: 23:00:00 via INTRAVENOUS

## 2017-05-31 MED ORDER — HYDRALAZINE HCL 25 MG PO TABS
25.0000 mg | ORAL_TABLET | Freq: Every day | ORAL | Status: DC
  Administered 2017-06-01 – 2017-06-03 (×3): 25 mg via ORAL
  Filled 2017-05-31 (×3): qty 1

## 2017-05-31 MED ORDER — ONDANSETRON HCL 4 MG/2ML IJ SOLN: 4.0000 mg | Freq: Four times a day (QID) | INTRAMUSCULAR | Status: DC | PRN

## 2017-05-31 MED ORDER — FENTANYL CITRATE (PF) 100 MCG/2ML IJ SOLN
25.0000 ug | Freq: Once | INTRAMUSCULAR | Status: AC
  Administered 2017-05-31: 25 ug via INTRAVENOUS
  Filled 2017-05-31: qty 2

## 2017-05-31 MED ORDER — ONDANSETRON HCL 4 MG PO TABS: 4.0000 mg | ORAL_TABLET | Freq: Four times a day (QID) | ORAL | Status: DC | PRN

## 2017-05-31 MED ORDER — AMLODIPINE BESYLATE 5 MG PO TABS
5.0000 mg | ORAL_TABLET | Freq: Every day | ORAL | Status: DC
Start: 2017-06-01 — End: 2017-06-03
  Administered 2017-06-01 – 2017-06-03 (×3): 5 mg via ORAL
  Filled 2017-05-31 (×3): qty 1

## 2017-05-31 MED ORDER — TRAMADOL HCL 50 MG PO TABS
50.0000 mg | ORAL_TABLET | Freq: Once | ORAL | Status: AC
Start: 2017-05-31 — End: 2017-05-31
  Administered 2017-05-31: 50 mg via ORAL
  Filled 2017-05-31: qty 1

## 2017-05-31 MED ORDER — BENAZEPRIL HCL 20 MG PO TABS
20.0000 mg | ORAL_TABLET | Freq: Every day | ORAL | Status: DC
  Administered 2017-06-01 – 2017-06-03 (×3): 20 mg via ORAL
  Filled 2017-05-31 (×3): qty 1

## 2017-05-31 MED ORDER — TRAMADOL HCL 50 MG PO TABS
50.0000 mg | ORAL_TABLET | Freq: Four times a day (QID) | ORAL | Status: DC | PRN
  Administered 2017-06-01 – 2017-06-02 (×2): 50 mg via ORAL
  Filled 2017-05-31 (×2): qty 1

## 2017-05-31 MED ORDER — AMLODIPINE BESY-BENAZEPRIL HCL 10-20 MG PO CAPS: 1.0000 | ORAL_CAPSULE | Freq: Every day | ORAL | Status: DC

## 2017-05-31 MED ORDER — ASPIRIN EC 81 MG PO TBEC
81.0000 mg | DELAYED_RELEASE_TABLET | Freq: Every day | ORAL | Status: DC
  Administered 2017-06-01 – 2017-06-03 (×3): 81 mg via ORAL
  Filled 2017-05-31 (×3): qty 1

## 2017-05-31 MED ORDER — DEXTROSE 5 % IV SOLN
1.0000 g | INTRAVENOUS | Status: DC
  Administered 2017-06-01 – 2017-06-02 (×2): 1 g via INTRAVENOUS
  Filled 2017-05-31 (×3): qty 10

## 2017-05-31 MED ORDER — METOPROLOL SUCCINATE ER 50 MG PO TB24
50.0000 mg | ORAL_TABLET | Freq: Every day | ORAL | Status: DC
Start: 2017-06-01 — End: 2017-06-03
  Administered 2017-06-01 – 2017-06-03 (×3): 50 mg via ORAL
  Filled 2017-05-31 (×3): qty 1

## 2017-05-31 MED ORDER — HEPARIN SODIUM (PORCINE) 5000 UNIT/ML IJ SOLN
5000.0000 [IU] | Freq: Three times a day (TID) | INTRAMUSCULAR | Status: DC
  Administered 2017-06-01 – 2017-06-03 (×6): 5000 [IU] via SUBCUTANEOUS
  Filled 2017-05-31 (×5): qty 1

## 2017-05-31 NOTE — ED Provider Notes (Signed)
[facility] Provider Note   CSN: [id number] Arrival date & time: 05/31/17  1611     History   Chief Complaint Chief Complaint  Patient presents with  . Fall    HPI Diana Anthony is a 84 y.o. female.  HPI   84 year old female with history below presents with concern for right-sided hip pelvic pain after a fall 3 days ago.  Patient was initially evaluated ED, later found by PCP to have pelvic fractures on for orthopedics.  Yesterday, she saw headaches recommend the nonweightbearing, pain control follow-up.  They are unable to fill the tramadol prescription were provided in, the pharmacist told them they needed to wait until the next d that [place].  Lives alone in independent living.  Reports that she has been unable to pivot, or walk with walker, and that she is having significant pain and they are having a difficult time caring for her at home. They began filling out paperwork for placement however have been unable to confirm anything and are struggling taking care of her.  Patient reports chest pain and hip pain.   Past Medical History:  Diagnosis Date  . Anxiety state, unspecified   . Dysthymic disorder   . First degree atrioventricular block   . Hyperglycemia 01/30/09  . Malignant neoplasm of breast (female), unspecified site   . Osteoarthrosis, unspecified whether generalized or localized, unspecified site   . Senile osteoporosis   . Trigger finger (acquired)   . Unspecified constipation   . Unspecified essential hypertension   . Unspecified hypothyroidism   . Unspecified vitamin D deficiency     Patient Active Problem List   Diagnosis Date Noted  . UTI (urinary tract infection) 05/31/2017  . Closed fracture of right superior rim of pubis ([place]) 05/30/2017  . Closed fracture of right inferior pubic ramus ([place]) 05/30/2017  . Compression fracture of L1 lumbar vertebra ([facility]) 05/30/2017  . Spondylolisthesis, lumbar region 05/30/2017  .  Chronic kidney disease (CKD) stage G2/A2, mildly decreased glomerular filtration rate (GFR) between 60-89 mL/min/1.73 square meter and albuminuria creatinine ratio between 30-299 mg/g 01/31/2015  . Memory impairment 01/31/2015  . Osteoarthritis of right knee 02/24/2014  . Urinary tract infection, site not specified 12/16/2012  . Unspecified vitamin D deficiency   . Anxiety state   . History of breast cancer in female   . Hypothyroidism   . Essential hypertension   . Hyperglycemia 01/30/2009    Past Surgical History:  Procedure Laterality Date  . CATARACT EXTRACTION BILATERAL W/ ANTERIOR VITRECTOMY Bilateral 2002  . MASTECTOMY Left 1986   Dr.Weatherly   . ORIF FACIAL FRACTURE  1962    OB History    No data available       Home Medications    Prior to Admission medications   Medication Sig Start Date End Date Taking? Authorizing Provider  acetaminophen (TYLENOL) 500 MG tablet Take 2 tablets (1,000 mg total) by mouth every 6 (six) hours as needed. Patient taking differently: Take 1,000 mg by mouth every 6 (six) hours as needed for mild pain.  05/27/17  Yes Charlesetta Shanks, MD  amLODipine-benazepril (LOTREL) 10-20 MG capsule TAKE 1 CAPSULE BY MOUTH DAILY 04/23/17  Yes Reed, Tiffany L, DO  aspirin EC 81 MG tablet Take 1 tablet (81 mg total) by mouth daily. 10/28/16  Yes Lauree Chandler, NP  chlordiazePOXIDE (LIBRIUM) 10 MG capsule Take one capsule daily as needed for rest or nerves 01/31/15  Yes [name],  MD  hydrALAZINE (APRESOLINE) 25 MG tablet Take 1 tablet (25 mg total) by mouth daily. 03/13/17  Yes Reed, Tiffany L, DO  levothyroxine (SYNTHROID, LEVOTHROID) 100 MCG tablet Take 1 tablet (100 mcg total) by mouth daily. 02/20/17  Yes Reed, Tiffany L, DO  metoprolol succinate (TOPROL-XL) 50 MG 24 hr tablet TAKE 1 TABLET BY MOUTH DAILY TO CONTROL BLOOD PRESSURE AND HEART RHYTHM.(TAKE WITH OR IMMEDIATELY FOLLOWING A MEAL) Patient taking differently: TAKE 1 TABLET (50mg ) BY MOUTH  DAILY TO CONTROL BLOOD PRESSURE AND HEART RHYTHM.(TAKE WITH OR IMMEDIATELY FOLLOWING A MEAL) 04/09/17  Yes Blanchie Serve, MD  traMADol (ULTRAM) 50 MG tablet 2 tablets every 6 hours as needed for pain.  You may take with Tylenol. 05/30/17  Yes Lauree Chandler, NP    Family History Family History  Problem Relation Age of Onset  . Cerebrovascular Accident Mother   . Hypertension Mother   . Cerebrovascular Accident Father   . Heart disease Sister   . Emphysema Brother     Social History Social History   Tobacco Use  . Smoking status: Never Smoker  . Smokeless tobacco: Never Used  Substance Use Topics  . Alcohol use: No    Alcohol/week: 0.0 oz  . Drug use: No     Allergies   Macrobid [nitrofurantoin macrocrystal]   Review of Systems Review of Systems  Constitutional: Negative for fever.  HENT: Negative for sore throat.   Eyes: Negative for visual disturbance.  Respiratory: Negative for cough and shortness of breath.   Cardiovascular: Positive for chest pain.  Gastrointestinal: Negative for abdominal pain, nausea and vomiting.  Genitourinary: Difficulty urinating: foul smelling urine.  Musculoskeletal: Positive for arthralgias and gait problem. Negative for back pain.  Skin: Negative for rash.  Neurological: Negative for syncope and headaches.     Physical Exam Updated Vital Signs BP (!) 170/74 (BP Location: Left Arm)   Pulse 72   Temp 98.8 F (37.1 C) (Oral)   Resp 17   Ht 5\' 3"  (1.6 m)   Wt 65.8 kg (145 lb)   SpO2 95%   BMI 25.69 kg/m   Physical Exam  Constitutional: She is oriented to person, place, and time. She appears well-developed and well-nourished. No distress.  HENT:  Head: Normocephalic and atraumatic.  Eyes: Conjunctivae and EOM are normal.  Neck: Normal range of motion.  Cardiovascular: Normal rate, regular rhythm, normal heart sounds and intact distal pulses. Exam reveals no gallop and no friction rub.  No murmur heard. Pulmonary/Chest:  Effort normal and breath sounds normal. No respiratory distress. She has no wheezes. She has no rales. She exhibits tenderness.  Abdominal: Soft. She exhibits no distension. There is no tenderness. There is no guarding.  Musculoskeletal: She exhibits no edema.       Right hip: She exhibits decreased range of motion, tenderness and bony tenderness.  Neurological: She is alert and oriented to person, place, and time.  Skin: Skin is warm and dry. No rash noted. She is not diaphoretic. No erythema.  Nursing note and vitals reviewed.    ED Treatments / Results  Labs (all labs ordered are listed, but only abnormal results are displayed) Labs Reviewed  COMPREHENSIVE METABOLIC PANEL - Abnormal; Notable for the following components:      Result Value   Potassium 3.2 (*)    Glucose, Bld 136 (*)    BUN 34 (*)    Creatinine, Ser 2.10 (*)    Calcium 8.7 (*)    Albumin 3.1 (*)  AST 49 (*)    GFR calc non Af Amer 18 (*)    GFR calc Af Amer 21 (*)    All other components within normal limits  URINALYSIS, ROUTINE W REFLEX MICROSCOPIC - Abnormal; Notable for the following components:   Hgb urine dipstick SMALL (*)    Protein, ur 100 (*)    Leukocytes, UA TRACE (*)    Bacteria, UA MANY (*)    All other components within normal limits  URINE CULTURE  URINE CULTURE  CBC WITH DIFFERENTIAL/PLATELET  COMPREHENSIVE METABOLIC PANEL  CBC  I-STAT TROPONIN, ED    EKG  EKG Interpretation  Date/Time:  Saturday May 31 2017 17:31:57 EST Ventricular Rate:  82 PR Interval:    QRS Duration: 98 QT Interval:  418 QTC Calculation: 489 R Axis:   134 Text Interpretation:  Sinus rhythm Supraventricular bigeminy Right axis deviation Consider left ventricular hypertrophy Nonspecific T abnormalities, lateral leads Borderline prolonged QT interval No significant change since last tracing Confirmed by Gareth Morgan [id number]) on 05/31/2017 6:30:06 PM       Radiology Dg Chest 1 View  Result Date:  05/31/2017 CLINICAL DATA:  Status post fall on Tuesday with intense pain. EXAM: CHEST 1 VIEW COMPARISON:  January 8 29 FINDINGS: The heart size and mediastinal contours are stable. The heart size is enlarged. The aorta is tortuous. There is no focal infiltrate, pulmonary edema, or pleural effusion. The visualized skeletal structures are stable. IMPRESSION: No active cardiopulmonary disease. Electronically Signed   By: Abelardo Diesel M.D.   On: 05/31/2017 18:28   Dg Lumbar Spine Complete  Result Date: 05/30/2017 CLINICAL DATA:  Fall. EXAM: LUMBAR SPINE - COMPLETE 4+ VIEW COMPARISON:  Chest x-ray 05/27/2017. FINDINGS: Soft tissue structures are unremarkable. Stool noted throughout the colon. No bowel distention. Osteopenia. Mild scoliosis lumbar spine. Degenerative changes lumbar spine and both hips. 9 mm anterolisthesis L4 on L5. Minimal L1 compression. Mild lower thoracic vertebral body compressions cannot be excluded. IMPRESSION: 1. Osteopenia and mild scoliosis lumbar spine. Diffuse osteopenia multilevel degenerative change. 9 mm anterolisthesis L4 on L5. 2. Mild L1 compression cannot be excluded. Multiple mild lower thoracic vertebral body compressions cannot be excluded. These changes may be old. Electronically Signed   By: Marcello Moores  Register   On: 05/30/2017 11:01   Dg Pelvis 1-2 Views  Result Date: 05/30/2017 CLINICAL DATA:  Fall 3 days ago, pain EXAM: PELVIS - 1-2 VIEW COMPARISON:  None. FINDINGS: Fractures are noted through the inferior and superior pubic rami on the right. The superior pubic ramus fracture is near the ischium and medial acetabulum. No proximal femoral fracture. No subluxation or dislocation. IMPRESSION: Right superior and inferior pubic rami fractures. Electronically Signed   By: Rolm Baptise M.D.   On: 05/30/2017 10:58   Dg Hip Unilat W Or Wo Pelvis 2-3 Views Right  Result Date: 05/31/2017 CLINICAL DATA:  Status post fall on Tuesday with intense pain of the right hip. EXAM: DG HIP  (WITH OR WITHOUT PELVIS) 2-3V RIGHT COMPARISON:  Pelvic x-ray May 30, 2017 FINDINGS: There are fractures at the right superior and inferior pubic rami unchanged compared prior exam. No other acute fracture or dislocation is identified. IMPRESSION: Fractures of the right superior and inferior pubic rami unchanged compared prior pelvic x-ray of May 30, 2017. Electronically Signed   By: Abelardo Diesel M.D.   On: 05/31/2017 18:26    Procedures Procedures (including critical care time)  Medications Ordered in ED Medications  aspirin EC tablet 81 mg (  not administered)  levothyroxine (SYNTHROID, LEVOTHROID) tablet 100 mcg (not administered)  hydrALAZINE (APRESOLINE) tablet 25 mg (not administered)  metoprolol succinate (TOPROL-XL) 24 hr tablet 50 mg (not administered)  acetaminophen (TYLENOL) tablet 1,000 mg (not administered)  traMADol (ULTRAM) tablet 50 mg (not administered)  heparin injection 5,000 Units (not administered)  0.45 % sodium chloride infusion ( Intravenous New Bag/Given 05/31/17 2230)  ondansetron (ZOFRAN) tablet 4 mg (not administered)    Or  ondansetron (ZOFRAN) injection 4 mg (not administered)  cefTRIAXone (ROCEPHIN) 1 g in dextrose 5 % 50 mL IVPB (not administered)  amLODipine (NORVASC) tablet 5 mg (not administered)    And  benazepril (LOTENSIN) tablet 20 mg (not administered)  traMADol (ULTRAM) tablet 50 mg (50 mg Oral Given 05/31/17 1820)  fentaNYL (SUBLIMAZE) injection 25 mcg (25 mcg Intravenous Given 05/31/17 1820)  cefTRIAXone (ROCEPHIN) 1 g in dextrose 5 % 50 mL IVPB (1 g Intravenous Transfusing/Transfer 05/31/17 2131)     Initial Impression / Assessment and Plan / ED Course  I have reviewed the triage vital signs and the nursing notes.  Pertinent labs & imaging results that were available during my care of the patient were reviewed by me and considered in my medical decision making (see chart for details).     84 year old female with history above presents  with concern for continued pain limiting mobility after fall and inferior and superior pubic rami fractures in the right, and foul-smelling urine.  Patient had also reported chest pain while in the emergency department.  EKG does not show any significant changes from prior.  Troponin is negative.  She has chest wall tenderness, suspect most likely musculoskeletal etiology of pain.  She had reported this pain initially after she fell.  Patient lives in independent living,, and given age, urinary tract infection, and pubic rami fractures limiting mobility, feels she requires admission for treatment of UTI, physical therapy, and possible placement into facility or rehab.  Admitted for further care.  Final Clinical Impressions(s) / ED Diagnoses   Final diagnoses:  Urinary tract infection without hematuria, site unspecified  Closed fracture of multiple pubic rami, right, sequela    ED Discharge Orders    None       Gareth Morgan, MD 06/01/17 0345

## 2017-05-31 NOTE — ED Notes (Signed)
Pt able to stand up using the walker with assistance, but no able to give steps due to increase pain.

## 2017-05-31 NOTE — ED Triage Notes (Signed)
Patient arrived to ED via [facility] from [place] in [place]. EMS reports:  Patient fell on Tues. Taken to ED. Reports CXR taken, but reports no pelvic xray. Patient seen by PCP yesterday with xrays. Reports two small cracks in R pelvis.  Patient experiencing more intense pain, and having difficulty moving/ambulating. + distal pulses.  BP 160/70, Pulse 76, Resp 18.

## 2017-05-31 NOTE — ED Notes (Signed)
Denies hip pain at this time. Hip pain with movement. Very painful.

## 2017-05-31 NOTE — H&P (Signed)
History and Physical    Diana Anthony TML:[id number] DOB: Jan 14, 1917 DOA: 05/31/2017  Referring MD/NP/PA: Billy Fischer  PCP: Gayland Curry, DO   Outpatient Specialists: Dr Gladstone Lighter   Patient coming from: Home  Chief Complaint: Fall, weakness  HPI: Diana Anthony is a 84 y.o. female with medical history significant of HTN as well as fall 3 days ago who was seen by her PCP and sent to ortho. She was found to have Pelvic Rami fractures and was home but has not been doing well. She lives alone and family unable to cater for her. She is seen in the Ed now and found to have UTI in addition to her other symptoms. No fever or chills but very weak. No NVD. Patient was other chronic medical  Including chronic kidney disease as well as hypertension.  ED Course: Patient given 1 g of Rocephine as well as IV hydration.  Review of Systems: As per HPI otherwise 10 point review of systems negative.    Past Medical History:  Diagnosis Date  . Anxiety state, unspecified   . Dysthymic disorder   . First degree atrioventricular block   . Hyperglycemia 01/30/09  . Malignant neoplasm of breast (female), unspecified site   . Osteoarthrosis, unspecified whether generalized or localized, unspecified site   . Senile osteoporosis   . Trigger finger (acquired)   . Unspecified constipation   . Unspecified essential hypertension   . Unspecified hypothyroidism   . Unspecified vitamin D deficiency     Past Surgical History:  Procedure Laterality Date  . CATARACT EXTRACTION BILATERAL W/ ANTERIOR VITRECTOMY Bilateral 2002  . MASTECTOMY Left 1986   Dr.Weatherly   . ORIF FACIAL FRACTURE  1962     reports that  has never smoked. she has never used smokeless tobacco. She reports that she does not drink alcohol or use drugs.  Allergies  Allergen Reactions  . Macrobid [Nitrofurantoin Macrocrystal] Other (See Comments)    unknown    Family History  Problem Relation Age of Onset  . Cerebrovascular  Accident Mother   . Hypertension Mother   . Cerebrovascular Accident Father   . Heart disease Sister   . Emphysema Brother      Prior to Admission medications   Medication Sig Start Date End Date Taking? Authorizing Provider  acetaminophen (TYLENOL) 500 MG tablet Take 2 tablets (1,000 mg total) by mouth every 6 (six) hours as needed. Patient taking differently: Take 1,000 mg by mouth every 6 (six) hours as needed for mild pain.  05/27/17  Yes Charlesetta Shanks, MD  amLODipine-benazepril (LOTREL) 10-20 MG capsule TAKE 1 CAPSULE BY MOUTH DAILY 04/23/17  Yes Reed, Tiffany L, DO  aspirin EC 81 MG tablet Take 1 tablet (81 mg total) by mouth daily. 10/28/16  Yes Lauree Chandler, NP  chlordiazePOXIDE (LIBRIUM) 10 MG capsule Take one capsule daily as needed for rest or nerves 01/31/15  Yes [name], MD  hydrALAZINE (APRESOLINE) 25 MG tablet Take 1 tablet (25 mg total) by mouth daily. 03/13/17  Yes Reed, Tiffany L, DO  levothyroxine (SYNTHROID, LEVOTHROID) 100 MCG tablet Take 1 tablet (100 mcg total) by mouth daily. 02/20/17  Yes Reed, Tiffany L, DO  metoprolol succinate (TOPROL-XL) 50 MG 24 hr tablet TAKE 1 TABLET BY MOUTH DAILY TO CONTROL BLOOD PRESSURE AND HEART RHYTHM.(TAKE WITH OR IMMEDIATELY FOLLOWING A MEAL) Patient taking differently: TAKE 1 TABLET (50mg ) BY MOUTH DAILY TO CONTROL BLOOD PRESSURE AND HEART RHYTHM.(TAKE WITH OR IMMEDIATELY FOLLOWING A MEAL)  04/09/17  Yes Blanchie Serve, MD  traMADol (ULTRAM) 50 MG tablet 2 tablets every 6 hours as needed for pain.  You may take with Tylenol. 05/30/17  Yes Lauree Chandler, NP    Physical Exam: Vitals:   05/31/17 1930 05/31/17 1945 05/31/17 2000 05/31/17 2011  BP: (!) 172/86 (!) 148/72 (!) 172/90   Pulse: 71 77 81   Resp: 20 17    Temp:      TempSrc:      SpO2: 94% (!) 88% 95%   Weight:    65.8 kg (145 lb)  Height:    5\' 3"  (1.6 m)      Constitutional: NAD, calm, comfortable Vitals:   05/31/17 1930 05/31/17 1945 05/31/17 2000  05/31/17 2011  BP: (!) 172/86 (!) 148/72 (!) 172/90   Pulse: 71 77 81   Resp: 20 17    Temp:      TempSrc:      SpO2: 94% (!) 88% 95%   Weight:    65.8 kg (145 lb)  Height:    5\' 3"  (1.6 m)   Eyes: PERRL, lids and conjunctivae normal ENMT: Mucous membranes are moist. Posterior pharynx clear of any exudate or lesions.Normal dentition.  Neck: normal, supple, no masses, no thyromegaly Respiratory: clear to auscultation bilaterally, no wheezing, no crackles. Normal respiratory effort. No accessory muscle use.  Cardiovascular: Regular rate and rhythm, no murmurs / rubs / gallops. No extremity edema. 2+ pedal pulses. No carotid bruits.  Abdomen: no tenderness, no masses palpated. No hepatosplenomegaly. Bowel sounds positive.  Musculoskeletal: no clubbing / cyanosis. No joint deformity upper and lower extremities. Good ROM, no contractures. Normal muscle tone.  Skin: no rashes, lesions, ulcers. No induration Neurologic: CN 2-12 grossly intact. Sensation intact, DTR normal. Strength 5/5 in all 4.  Psychiatric: Normal judgment and insight. Alert and oriented x 3. Normal mood.   Labs on Admission: I have personally reviewed following labs and imaging studies  CBC: Recent Labs  Lab 05/30/17 1004 05/31/17 1735  WBC 9.6 8.5  NEUTROABS 7,373 6.6  HGB 12.2 12.1  HCT 35.8 36.6  MCV 92.0 94.6  PLT 186 595   Basic Metabolic Panel: Recent Labs  Lab 05/30/17 1004 05/31/17 1735  NA 136 136  K 4.2 3.2*  CL 99 102  CO2 25 22  GLUCOSE 116* 136*  BUN 32* 34*  CREATININE 2.01* 2.10*  CALCIUM 9.0 8.7*   GFR: Estimated Creatinine Clearance: 13.6 mL/min (A) (by C-G formula based on SCr of 2.1 mg/dL (H)). Liver Function Tests: Recent Labs  Lab 05/31/17 1735  AST 49*  ALT 22  ALKPHOS 107  BILITOT 0.8  PROT 6.8  ALBUMIN 3.1*   No results for input(s): LIPASE, AMYLASE in the last 168 hours. No results for input(s): AMMONIA in the last 168 hours. Coagulation Profile: No results for  input(s): INR, PROTIME in the last 168 hours. Cardiac Enzymes: No results for input(s): CKTOTAL, CKMB, CKMBINDEX, TROPONINI in the last 168 hours. BNP (last 3 results) No results for input(s): PROBNP in the last 8760 hours. HbA1C: No results for input(s): HGBA1C in the last 72 hours. CBG: No results for input(s): GLUCAP in the last 168 hours. Lipid Profile: No results for input(s): CHOL, HDL, LDLCALC, TRIG, CHOLHDL, LDLDIRECT in the last 72 hours. Thyroid Function Tests: Recent Labs    05/30/17 1004  TSH 12.98*   Anemia Panel: No results for input(s): VITAMINB12, FOLATE, FERRITIN, TIBC, IRON, RETICCTPCT in the last 72 hours. Urine analysis:  Component Value Date/Time   COLORURINE YELLOW 05/31/2017 1925   APPEARANCEUR CLEAR 05/31/2017 1925   LABSPEC 1.016 05/31/2017 1925   PHURINE 5.0 05/31/2017 1925   GLUCOSEU NEGATIVE 05/31/2017 1925   HGBUR SMALL (A) 05/31/2017 1925   BILIRUBINUR NEGATIVE 05/31/2017 1925   BILIRUBINUR negative 12/16/2012 [place] 05/31/2017 1925   PROTEINUR 100 (A) 05/31/2017 1925   UROBILINOGEN negative 12/16/2012 1449   NITRITE NEGATIVE 05/31/2017 1925   LEUKOCYTESUR TRACE (A) 05/31/2017 1925   Sepsis Labs: @LABRCNTIP (procalcitonin:4,lacticidven:4) )No results found for this or any previous visit (from the past 240 hour(s)).   Radiological Exams on Admission: Dg Chest 1 View  Result Date: 05/31/2017 CLINICAL DATA:  Status post fall on Tuesday with intense pain. EXAM: CHEST 1 VIEW COMPARISON:  January 8 29 FINDINGS: The heart size and mediastinal contours are stable. The heart size is enlarged. The aorta is tortuous. There is no focal infiltrate, pulmonary edema, or pleural effusion. The visualized skeletal structures are stable. IMPRESSION: No active cardiopulmonary disease. Electronically Signed   By: Abelardo Diesel M.D.   On: 05/31/2017 18:28   Dg Lumbar Spine Complete  Result Date: 05/30/2017 CLINICAL DATA:  Fall. EXAM: LUMBAR  SPINE - COMPLETE 4+ VIEW COMPARISON:  Chest x-ray 05/27/2017. FINDINGS: Soft tissue structures are unremarkable. Stool noted throughout the colon. No bowel distention. Osteopenia. Mild scoliosis lumbar spine. Degenerative changes lumbar spine and both hips. 9 mm anterolisthesis L4 on L5. Minimal L1 compression. Mild lower thoracic vertebral body compressions cannot be excluded. IMPRESSION: 1. Osteopenia and mild scoliosis lumbar spine. Diffuse osteopenia multilevel degenerative change. 9 mm anterolisthesis L4 on L5. 2. Mild L1 compression cannot be excluded. Multiple mild lower thoracic vertebral body compressions cannot be excluded. These changes may be old. Electronically Signed   By: Marcello Moores  Register   On: 05/30/2017 11:01   Dg Pelvis 1-2 Views  Result Date: 05/30/2017 CLINICAL DATA:  Fall 3 days ago, pain EXAM: PELVIS - 1-2 VIEW COMPARISON:  None. FINDINGS: Fractures are noted through the inferior and superior pubic rami on the right. The superior pubic ramus fracture is near the ischium and medial acetabulum. No proximal femoral fracture. No subluxation or dislocation. IMPRESSION: Right superior and inferior pubic rami fractures. Electronically Signed   By: Rolm Baptise M.D.   On: 05/30/2017 10:58   Dg Hip Unilat W Or Wo Pelvis 2-3 Views Right  Result Date: 05/31/2017 CLINICAL DATA:  Status post fall on Tuesday with intense pain of the right hip. EXAM: DG HIP (WITH OR WITHOUT PELVIS) 2-3V RIGHT COMPARISON:  Pelvic x-ray May 30, 2017 FINDINGS: There are fractures at the right superior and inferior pubic rami unchanged compared prior exam. No other acute fracture or dislocation is identified. IMPRESSION: Fractures of the right superior and inferior pubic rami unchanged compared prior pelvic x-ray of May 30, 2017. Electronically Signed   By: Abelardo Diesel M.D.   On: 05/31/2017 18:26    EKG: Independently reviewed.  Assessment/Plan Principal Problem:   Urinary tract infection, site not  specified Active Problems:   Anxiety state   Essential hypertension   Chronic kidney disease (CKD) stage G2/A2, mildly decreased glomerular filtration rate (GFR) between 60-89 mL/min/1.73 square meter and albuminuria creatinine ratio between 30-299 mg/g   Closed fracture of right superior rim of pubis (HCC)   Closed fracture of right inferior pubic ramus (HCC)   UTI (urinary tract infection)    #1 urinary tract infection: Patient will be admitted and started on IV Rocephin. We  will get urine culture and sensitivities with blood cultures. When the results come back we will transition patient to oral antibiotics according to sensitivity.  #2 status post fall: Patient is not safe at home. We will get physical therapy and occupational therapy to evaluate patient and treat accordingly. She might require skilled nursing placement.  #3 pelvic fracture: From recent fall. Physical therapy with occupational therapy will be ordered and pain management.  #4 hypertension: Continue home medications. Monitor blood pressure  #5 chronic kidney disease stage II: Continue to monitor renal function.  #6 Hypokalemia: Will replete.   DVT prophylaxis: Lovenox   Code Status: Full  Family Communication: Daughters in room   Disposition Plan: [place] called: None  Admission status: Inpatient   Severity of Illness: The appropriate patient status for this patient is INPATIENT. Inpatient status is judged to be reasonable and necessary in order to provide the required intensity of service to ensure the patient's safety. The patient's presenting symptoms, physical exam findings, and initial radiographic and laboratory data in the context of their chronic comorbidities is felt to place them at high risk for further clinical deterioration. Furthermore, it is not anticipated that the patient will be medically stable for discharge from the hospital within 2 midnights of admission. The  following factors support the patient status of inpatient.   " The patient's presenting symptoms include Pain, weakness. " The worrisome physical exam findings include Dehydration, tenderness in pelvic area. " The initial radiographic and laboratory data are worrisome because of Fracture of pelvis. " The chronic co-morbidities include CKDIII, HTN.   * I certify that at the point of admission it is my clinical judgment that the patient will require inpatient hospital care spanning beyond 2 midnights from the point of admission due to high intensity of service, high risk for further deterioration and high frequency of surveillance required.Barbette Merino MD [facility] Pager [phone]  If 7PM-7AM, please contact night-coverage [email or web address] Password [facility]  05/31/2017, 8:55 PM

## 2017-05-31 NOTE — ED Notes (Signed)
Pt was able to stand but unable to ambulate. Pt was in pain while attempting to move legs.

## 2017-05-31 NOTE — ED Notes (Signed)
Patient transported to X-ray 

## 2017-06-01 DIAGNOSIS — S32591S Other specified fracture of right pubis, sequela: Secondary | ICD-10-CM

## 2017-06-01 DIAGNOSIS — N179 Acute kidney failure, unspecified: Secondary | ICD-10-CM

## 2017-06-01 LAB — MAGNESIUM: MAGNESIUM: 1.7 mg/dL (ref 1.7–2.4)

## 2017-06-01 LAB — COMPREHENSIVE METABOLIC PANEL
ALBUMIN: 2.7 g/dL — AB (ref 3.5–5.0)
ALK PHOS: 85 U/L (ref 38–126)
ALT: 18 U/L (ref 14–54)
AST: 37 U/L (ref 15–41)
Anion gap: 13 (ref 5–15)
BUN: 29 mg/dL — ABNORMAL HIGH (ref 6–20)
CALCIUM: 8.2 mg/dL — AB (ref 8.9–10.3)
CHLORIDE: 102 mmol/L (ref 101–111)
CO2: 22 mmol/L (ref 22–32)
Creatinine, Ser: 1.78 mg/dL — ABNORMAL HIGH (ref 0.44–1.00)
GFR calc Af Amer: 26 mL/min — ABNORMAL LOW (ref >60)
GFR calc non Af Amer: 23 mL/min — ABNORMAL LOW (ref >60)
GLUCOSE: 105 mg/dL — AB (ref 65–99)
Potassium: 2.9 mmol/L — ABNORMAL LOW (ref 3.5–5.1)
SODIUM: 137 mmol/L (ref 135–145)
Total Bilirubin: 1 mg/dL (ref 0.3–1.2)
Total Protein: 6.4 g/dL — ABNORMAL LOW (ref 6.5–8.1)

## 2017-06-01 LAB — CBC
HEMATOCRIT: 32.5 % — AB (ref 36.0–46.0)
HEMOGLOBIN: 10.7 g/dL — AB (ref 12.0–15.0)
MCH: 31 pg (ref 26.0–34.0)
MCHC: 32.9 g/dL (ref 30.0–36.0)
MCV: 94.2 fL (ref 78.0–100.0)
Platelets: 198 10*3/uL (ref 150–400)
RBC: 3.45 MIL/uL — ABNORMAL LOW (ref 3.87–5.11)
RDW: 14.1 % (ref 11.5–15.5)
WBC: 7.3 10*3/uL (ref 4.0–10.5)

## 2017-06-01 MED ORDER — POTASSIUM CHLORIDE 10 MEQ/100ML IV SOLN
10.0000 meq | INTRAVENOUS | Status: AC
  Administered 2017-06-01: 10 meq via INTRAVENOUS
  Filled 2017-06-01 (×3): qty 100

## 2017-06-01 NOTE — Evaluation (Signed)
Occupational Therapy Evaluation Patient Details Name: Diana Anthony MRN: [id number] DOB: 07/14/1916 Today's Date: 06/01/2017    History of Present Illness Diana Anthony is a 84 y.o. female with medical history significant of HTN as well as fall 3 days ago who was seen by her PCP and sent to ortho. She was found to have Pelvic Rami fractures and was home but has not been doing well. She lives alone and family unable to cater for her. She is seen in the Ed now and found to have UTI in addition to her other symptoms.   Clinical Impression   Pt reports prior to recent fall with fractures she was managing independently with most ADL and used a RW for mobility. Currently pt requires total assist +2 for squat pivot transfer, total assist for LB ADL, and min assist for UB ADL in sitting. Recommending SNF for follow up to maximize independence and safety with ADL and functional mobility prior to return home ([facility]). Pt would benefit from continued skilled OT to address established goals.  OF NOTE: Following transfer pt c/o chest pain--reports heaviness and sharpness in the middle of her chest. BP 177/83, SpO2 97%, HR ~86 but variable. BP after sitting x5 mintues 183/76. RN notified.    Follow Up Recommendations  SNF;Supervision/Assistance - 24 hour    Equipment Recommendations  Other (comment)(TBD at next venue of care)    Recommendations for Other Services       Precautions / Restrictions Precautions Precautions: Fall Restrictions Weight Bearing Restrictions: Yes RLE Weight Bearing: Non weight bearing Other Position/Activity Restrictions: Per MD note, no orders      Mobility Bed Mobility Overal bed mobility: Needs Assistance Bed Mobility: Supine to Sit;Rolling Rolling: Max assist;+2 for physical assistance(to roll in chair)   Supine to sit: Max assist;+2 for physical assistance;HOB elevated     General bed mobility comments: Assist for LEs to EOB and trunk elevation to  sitting. Use of bed pad to scoot hips around to EOB.   Transfers Overall transfer level: Needs assistance   Transfers: Squat Pivot Transfers     Squat pivot transfers: Total assist;+2 physical assistance     General transfer comment: Total assist for squat pviot transfer and second person assist to maintain RLE NWB. Pt transferred to L side.    Balance Overall balance assessment: Needs assistance;History of Falls Sitting-balance support: Feet supported Sitting balance-Leahy Scale: Fair     Standing balance support: Bilateral upper extremity supported Standing balance-Leahy Scale: Zero Standing balance comment: Total assist to maintain upright standing                           ADL either performed or assessed with clinical judgement   ADL Overall ADL's : Needs assistance/impaired Eating/Feeding: Set up;Sitting   Grooming: Set up;Supervision/safety;Sitting   Upper Body Bathing: Minimal assistance;Sitting   Lower Body Bathing: Total assistance   Upper Body Dressing : Minimal assistance;Sitting   Lower Body Dressing: Total assistance;Bed level   Toilet Transfer: +2 for physical assistance;Squat-pivot;Total assistance Toilet Transfer Details (indicate cue type and reason): Simulated by squat pivot EOB to chair. Total assist for transfer and second person assist to maintain RLE NWB         Functional mobility during ADLs: +2 for physical assistance;Total assistance(for squat pivot only)       Vision         Perception     Praxis  Pertinent Vitals/Pain Pain Assessment: Faces Faces Pain Scale: Hurts worst Pain Location: R hip, chest Pain Descriptors / Indicators: Aching;Sharp;Sore Pain Intervention(s): Monitored during session;Repositioned     Hand Dominance     Extremity/Trunk Assessment Upper Extremity Assessment Upper Extremity Assessment: Generalized weakness   Lower Extremity Assessment Lower Extremity Assessment: Defer to PT  evaluation       Communication Communication Communication: HOH   Cognition Arousal/Alertness: Awake/alert Behavior During Therapy: WFL for tasks assessed/performed Overall Cognitive Status: Within Functional Limits for tasks assessed                                     General Comments  Following transfer pt c/o chest pain--reports heaviness and sharpness in the middle of her chest. BP 177/83, SpO2 97%, HR ~86 but variable. BP after sitting x5 mintues 183/76. RN notified.    Exercises     Shoulder Instructions      Home Living Family/patient expects to be discharged to:: Unsure Living Arrangements: Alone Available Help at Discharge: Family;Available PRN/intermittently Type of Home: Independent living facility Home Access: Elevator     Home Layout: One level     Bathroom Shower/Tub: Occupational psychologist: Handicapped height     Home Equipment: Environmental consultant - 4 wheels;Grab bars - toilet;Grab bars - tub/shower   Additional Comments: Lives at [facility]      Prior Functioning/Environment Level of Independence: Independent with assistive device(s)        Comments: RW for mobility prior to fxs, mod I with BADL, cooks, does some cleaning, son assists with laundry and groceries        OT Problem List: Decreased strength;Decreased activity tolerance;Impaired balance (sitting and/or standing);Decreased knowledge of use of DME or AE;Decreased safety awareness;Decreased knowledge of precautions;Pain      OT Treatment/Interventions: Self-care/ADL training;Therapeutic exercise;Energy conservation;DME and/or AE instruction;Therapeutic activities;Patient/family education;Balance training    OT Goals(Current goals can be found in the care plan section) Acute Rehab OT Goals Patient Stated Goal: decrease pain OT Goal Formulation: With patient Time For Goal Achievement: 06/15/17 Potential to Achieve Goals: Good ADL Goals Pt Will Perform Lower Body  Bathing: with min assist;with adaptive equipment;sitting/lateral leans;sit to/from stand Pt Will Perform Lower Body Dressing: with min assist;sitting/lateral leans;sit to/from stand;with adaptive equipment Pt Will Transfer to Toilet: with min assist;bedside commode;stand pivot transfer Pt Will Perform Toileting - Clothing Manipulation and hygiene: with min assist;sit to/from stand  OT Frequency: Min 2X/week   Barriers to D/C: Decreased caregiver support  pt lives alone       Co-evaluation PT/OT/SLP Co-Evaluation/Treatment: Yes Reason for Co-Treatment: Complexity of the patient's impairments (multi-system involvement);For patient/therapist safety   OT goals addressed during session: ADL's and self-care      AM-PAC PT "6 Clicks" Daily Activity     Outcome Measure Help from another person eating meals?: None Help from another person taking care of personal grooming?: A Little Help from another person toileting, which includes using toliet, bedpan, or urinal?: Total Help from another person bathing (including washing, rinsing, drying)?: A Lot Help from another person to put on and taking off regular upper body clothing?: A Little Help from another person to put on and taking off regular lower body clothing?: Total 6 Click Score: 14   End of Session Equipment Utilized During Treatment: Gait belt Nurse Communication: Mobility status;Weight bearing status;Other (comment)(pt reporting chest pain, no chair alarm box in room)  Activity Tolerance: Patient tolerated treatment well Patient left: in chair;with call bell/phone within reach;with SCD's reapplied  OT Visit Diagnosis: Other abnormalities of gait and mobility (R26.89);Pain Pain - Right/Left: Right Pain - part of body: Leg                Time: 5056-9794 OT Time Calculation (min): 26 min Charges:  OT General Charges $OT Visit: 1 Visit OT Evaluation $OT Eval Moderate Complexity: 1 Mod G-Codes:     Octavie Westerhold A. Ulice Brilliant, M.S.,  OTR/L Pager: [place] 06/01/2017, 9:21 AM

## 2017-06-01 NOTE — Evaluation (Signed)
Physical Therapy Evaluation Patient Details Name: Diana Anthony MRN: [id number] DOB: [birth year] Today's Date: 06/01/2017   History of Present Illness  Diana Anthony is a 84 y.o. female with medical history significant of HTN as well as fall 3 days ago who was seen by her PCP and sent to ortho. She was found to have Pelvic Rami fractures and was home but has not been doing well. She lives alone and family unable to cater for her. She is seen in the Ed now and found to have UTI in addition to her other symptoms.  Clinical Impression  Pt admitted with above diagnosis. Pt currently with functional limitations due to the deficits listed below (see PT Problem List). Pt reports prior to recent fall with fractures she was managing independently with most ADL and used a RW for mobility. Currently pt requires total assist +2 for squat pivot transfer, total assist for LB ADL, and min assist for UB ADL in sitting. Recommending SNF for follow up to maximize independence and safety with ADL and functional mobility prior to return home ([facility]).  Pt will benefit from skilled PT to increase their independence and safety with mobility to allow discharge to the venue listed below.       Follow Up Recommendations SNF    Equipment Recommendations  Rolling walker with 5" wheels;3in1 (PT);Wheelchair (measurements PT);Wheelchair cushion (measurements PT)    Recommendations for Other Services       Precautions / Restrictions Precautions Precautions: Fall Restrictions Weight Bearing Restrictions: No RLE Weight Bearing: Non weight bearing Other Position/Activity Restrictions: Per MD note, no orders      Mobility  Bed Mobility Overal bed mobility: Needs Assistance Bed Mobility: Supine to Sit;Rolling Rolling: Max assist;+2 for physical assistance(to roll in chair)   Supine to sit: Max assist;+2 for physical assistance;HOB elevated     General bed mobility comments: Assist for LEs to EOB and trunk  elevation to sitting. Use of bed pad to scoot hips around to EOB.   Transfers Overall transfer level: Needs assistance Equipment used: 2 person hand held assist(support bilaterally at gait belt) Transfers: Squat Pivot Transfers     Squat pivot transfers: Total assist;+2 physical assistance     General transfer comment: Total assist for squat pviot transfer and second person assist to maintain RLE NWB. Pt transferred to L side. Blocked L knee for stabilty  Ambulation/Gait                Stairs            Wheelchair Mobility    Modified Rankin (Stroke Patients Only)       Balance Overall balance assessment: Needs assistance;History of Falls Sitting-balance support: Feet supported Sitting balance-Leahy Scale: Fair     Standing balance support: Bilateral upper extremity supported Standing balance-Leahy Scale: Zero Standing balance comment: Total assist to maintain upright standing                             Pertinent Vitals/Pain Pain Assessment: Faces Faces Pain Scale: Hurts worst Pain Location: R hip, chest Pain Descriptors / Indicators: Aching;Sharp;Sore Pain Intervention(s): Monitored during session    Home Living Family/patient expects to be discharged to:: Unsure Living Arrangements: Alone Available Help at Discharge: Family;Available PRN/intermittently Type of Home: Independent living facility Home Access: Elevator     Home Layout: One level Home Equipment: Walker - 4 wheels;Grab bars - toilet;Grab bars - tub/shower Additional Comments:  Lives at [facility]    Prior Function Level of Independence: Independent with assistive device(s)         Comments: RW for mobility prior to fxs, mod I with BADL, cooks, does some cleaning, son assists with laundry and groceries     Hand Dominance        Extremity/Trunk Assessment   Upper Extremity Assessment Upper Extremity Assessment: Defer to OT evaluation    Lower Extremity  Assessment Lower Extremity Assessment: RLE deficits/detail RLE Deficits / Details: Significantly decr tolerance of any motion of R hip RLE: Unable to fully assess due to pain       Communication   Communication: HOH  Cognition Arousal/Alertness: Awake/alert Behavior During Therapy: WFL for tasks assessed/performed Overall Cognitive Status: Within Functional Limits for tasks assessed                                        General Comments General comments (skin integrity, edema, etc.): Following transfer pt c/o chest pain--reports heaviness and sharpness in the middle of her chest. BP 177/83, SpO2 97%, HR ~86 but variable. BP after sitting x5 mintues 183/76. RN notified.    Exercises     Assessment/Plan    PT Assessment Patient needs continued PT services  PT Problem List Decreased strength;Decreased range of motion;Decreased activity tolerance;Decreased balance;Decreased mobility;Decreased coordination;Decreased cognition;Decreased knowledge of use of DME;Decreased safety awareness;Decreased knowledge of precautions;Pain       PT Treatment Interventions DME instruction;Gait training;Functional mobility training;Therapeutic activities;Therapeutic exercise;Balance training;Cognitive remediation;Patient/family education;Neuromuscular re-education    PT Goals (Current goals can be found in the Care Plan section)  Acute Rehab PT Goals Patient Stated Goal: decrease pain PT Goal Formulation: With patient Time For Goal Achievement: 06/15/17 Potential to Achieve Goals: Fair    Frequency Min 2X/week   Barriers to discharge        Co-evaluation PT/OT/SLP Co-Evaluation/Treatment: Yes Reason for Co-Treatment: For patient/therapist safety PT goals addressed during session: Mobility/safety with mobility OT goals addressed during session: ADL's and self-care       AM-PAC PT "6 Clicks" Daily Activity  Outcome Measure Difficulty turning over in bed (including  adjusting bedclothes, sheets and blankets)?: Unable Difficulty moving from lying on back to sitting on the side of the bed? : Unable Difficulty sitting down on and standing up from a chair with arms (e.g., wheelchair, bedside commode, etc,.)?: Unable Help needed moving to and from a bed to chair (including a wheelchair)?: Total Help needed walking in hospital room?: Total Help needed climbing 3-5 steps with a railing? : Total 6 Click Score: 6    End of Session Equipment Utilized During Treatment: Gait belt Activity Tolerance: Patient limited by pain Patient left: in chair;with call bell/phone within reach;with chair alarm set Nurse Communication: Mobility status PT Visit Diagnosis: Pain;Other abnormalities of gait and mobility (R26.89);Muscle weakness (generalized) (M62.81) Pain - Right/Left: Right Pain - part of body: Leg;Hip    Time: 6010-9323 PT Time Calculation (min) (ACUTE ONLY): 26 min   Charges:   PT Evaluation $PT Eval Moderate Complexity: 1 Mod     PT G Codes:        Roney Marion, PT  Acute Rehabilitation Services Pager [phone] Office [phone]   Colletta [place] 06/01/2017, 12:52 PM

## 2017-06-01 NOTE — Progress Notes (Addendum)
PROGRESS NOTE [facility]   MADDISEN VOUGHT   EXB:[id number] DOB: 09/16/1916  DOA: 05/31/2017 PCP: Gayland Curry, DO   Brief Narrative:  Diana Anthony is a 84 y/o F with medical history significant for hypertension who fell 3 days ago and was seen by her  PCP who found a pelvic pubis from a fracture.  Was seen by orthopedic as an outpatient who recommended nonweight bearing on the right, and follow-up in 2-week for repeat x-ray.  Patient presented to the emergency department after having significant pain and unable to move.  Family report that she is not able to take care of herself as she lives alone.  Also family reported that her urine has a strong smell and patient have been more incontinent.  Upon ED evaluation patient was found to have grossly abnormal UA and elevated creatinine therefore patient was admitted for further treatment.  Subjective: Patient seen and examined she is sitting up in chair she has no complaints.  Only has pain when she moves.  Denies chest pain shortness of breath and palpitations.  Assessment & Plan: UTI - grossly abnormal UA Urine culture growing > 100 gram-negative rodsK Continue empiric antibiotics Follow-up sensitivities  Status post mechanical fall/fracture of the superior ramus of the right pubis Seen by orthopedic surgery as an outpatient and recommended nonweightbearing on the right, may weight-bear on the left to pivot o transfer  AKI on CKD stage III Baseline creatinine around 1.5 Being treated with IV fluid with good improvement. Continue gentle hydration Monitor creatinine in the morning  Hypertension BP stable Continue home medications  Hypokalemia Replete Check BMP and mag in the morning  DVT prophylaxis: Lovenox  Code Status: DNR Family Communication: Family at bedside Disposition Plan: SNF when bed available  Consultants:   None  Procedures:   None  Antimicrobials: Anti-infectives (From admission, onward)   Start     Dose/Rate Route Frequency Ordered Stop   06/01/17 2000  cefTRIAXone (ROCEPHIN) 1 g in dextrose 5 % 50 mL IVPB     1 g 100 mL/hr over 30 Minutes Intravenous Every 24 hours 05/31/17 2138     05/31/17 2030  cefTRIAXone (ROCEPHIN) 1 g in dextrose 5 % 50 mL IVPB     1 g 100 mL/hr over 30 Minutes Intravenous  Once 05/31/17 2027 05/31/17 2129        Objective: Vitals:   05/31/17 2100 05/31/17 2205 06/01/17 0538 06/01/17 1300  BP: (!) 153/88 (!) 170/74 (!) 164/79 (!) 159/77  Pulse: 77 72 69 70  Resp:    18  Temp:  98.8 F (37.1 C) 98.8 F (37.1 C) 98.8 F (37.1 C)  TempSrc:  Oral Oral Oral  SpO2: 94% 95% 94% 95%  Weight:      Height:        Intake/Output Summary (Last 24 hours) at 06/01/2017 1504 Last data filed at 06/01/2017 0539 Gross per 24 hour  Intake -  Output 350 ml  Net -350 ml   Filed Weights   05/31/17 2011  Weight: 65.8 kg (145 lb)    Examination:  General exam: Appears calm and comfortable  Respiratory system: Clear to auscultation. No wheezes,crackle or rhonchi Cardiovascular system: S1 & S2 heard, RRR. No JVD, murmurs, rubs or gallops Gastrointestinal system: Abdomen is nondistended, soft and nontender.  Central nervous system: Alert and oriented. No focal neurological deficits. Extremities: No pedal edema. Symmetric, strength 5/5, range of movement limited on the right Skin: No rashes, lesions or ulcers  Psychiatry: Mood & affect appropriate.    Data Reviewed: I have personally reviewed following labs and imaging studies  CBC: Recent Labs  Lab 05/30/17 1004 05/31/17 1735 06/01/17 0641  WBC 9.6 8.5 7.3  NEUTROABS 7,373 6.6  --   HGB 12.2 12.1 10.7*  HCT 35.8 36.6 32.5*  MCV 92.0 94.6 94.2  PLT 186 192 151   Basic Metabolic Panel: Recent Labs  Lab 05/30/17 1004 05/31/17 1735 06/01/17 0641  NA 136 136 137  K 4.2 3.2* 2.9*  CL 99 102 102  CO2 25 22 22   GLUCOSE 116* 136* 105*  BUN 32* 34* 29*  CREATININE 2.01* 2.10* 1.78*    CALCIUM 9.0 8.7* 8.2*  MG  --   --  1.7   GFR: Estimated Creatinine Clearance: 16.1 mL/min (A) (by C-G formula based on SCr of 1.78 mg/dL (H)). Liver Function Tests: Recent Labs  Lab 05/31/17 1735 06/01/17 0641  AST 49* 37  ALT 22 18  ALKPHOS 107 85  BILITOT 0.8 1.0  PROT 6.8 6.4*  ALBUMIN 3.1* 2.7*   No results for input(s): LIPASE, AMYLASE in the last 168 hours. No results for input(s): AMMONIA in the last 168 hours. Coagulation Profile: No results for input(s): INR, PROTIME in the last 168 hours. Cardiac Enzymes: No results for input(s): CKTOTAL, CKMB, CKMBINDEX, TROPONINI in the last 168 hours. BNP (last 3 results) No results for input(s): PROBNP in the last 8760 hours. HbA1C: No results for input(s): HGBA1C in the last 72 hours. CBG: No results for input(s): GLUCAP in the last 168 hours. Lipid Profile: No results for input(s): CHOL, HDL, LDLCALC, TRIG, CHOLHDL, LDLDIRECT in the last 72 hours. Thyroid Function Tests: Recent Labs    05/30/17 1004  TSH 12.98*   Anemia Panel: No results for input(s): VITAMINB12, FOLATE, FERRITIN, TIBC, IRON, RETICCTPCT in the last 72 hours. Sepsis Labs: No results for input(s): PROCALCITON, LATICACIDVEN in the last 168 hours.  Recent Results (from the past 240 hour(s))  Urine culture     Status: Abnormal (Preliminary result)   Collection Time: 05/31/17  7:25 PM  Result Value Ref Range Status   Specimen Description URINE, CLEAN CATCH  Final   Special Requests NONE  Final   Culture >=100,000 COLONIES/mL GRAM NEGATIVE RODS (A)  Final   Report Status PENDING  Incomplete      Radiology Studies: Dg Chest 1 View  Result Date: 05/31/2017 CLINICAL DATA:  Status post fall on Tuesday with intense pain. EXAM: CHEST 1 VIEW COMPARISON:  January 8 29 FINDINGS: The heart size and mediastinal contours are stable. The heart size is enlarged. The aorta is tortuous. There is no focal infiltrate, pulmonary edema, or pleural effusion. The  visualized skeletal structures are stable. IMPRESSION: No active cardiopulmonary disease. Electronically Signed   By: Abelardo Diesel M.D.   On: 05/31/2017 18:28   Dg Hip Unilat W Or Wo Pelvis 2-3 Views Right  Result Date: 05/31/2017 CLINICAL DATA:  Status post fall on Tuesday with intense pain of the right hip. EXAM: DG HIP (WITH OR WITHOUT PELVIS) 2-3V RIGHT COMPARISON:  Pelvic x-ray May 30, 2017 FINDINGS: There are fractures at the right superior and inferior pubic rami unchanged compared prior exam. No other acute fracture or dislocation is identified. IMPRESSION: Fractures of the right superior and inferior pubic rami unchanged compared prior pelvic x-ray of May 30, 2017. Electronically Signed   By: Abelardo Diesel M.D.   On: 05/31/2017 18:26      Scheduled Meds: .  amLODipine  5 mg Oral Daily   And  . benazepril  20 mg Oral Daily  . aspirin EC  81 mg Oral Daily  . heparin  5,000 Units Subcutaneous Q8H  . hydrALAZINE  25 mg Oral Daily  . levothyroxine  100 mcg Oral QAC breakfast  . metoprolol succinate  50 mg Oral Daily   Continuous Infusions: . sodium chloride 50 mL/hr at 05/31/17 2230  . cefTRIAXone (ROCEPHIN)  IV       LOS: 1 day    Time spent: Total of 25 minutes spent with pt, greater than 50% of which was spent in discussion of  treatment, counseling and coordination of care  Chipper Oman, MD Pager: Text Page via [email or web address]   If 7PM-7AM, please contact night-coverage [email or web address] 06/01/2017, 3:04 PM

## 2017-06-02 DIAGNOSIS — S32511D Fracture of superior rim of right pubis, subsequent encounter for fracture with routine healing: Secondary | ICD-10-CM

## 2017-06-02 DIAGNOSIS — N182 Chronic kidney disease, stage 2 (mild): Secondary | ICD-10-CM

## 2017-06-02 DIAGNOSIS — W19XXXD Unspecified fall, subsequent encounter: Secondary | ICD-10-CM

## 2017-06-02 DIAGNOSIS — I10 Essential (primary) hypertension: Secondary | ICD-10-CM

## 2017-06-02 LAB — BASIC METABOLIC PANEL
Anion gap: 13 (ref 5–15)
BUN: 26 mg/dL — AB (ref 6–20)
CALCIUM: 8.5 mg/dL — AB (ref 8.9–10.3)
CO2: 22 mmol/L (ref 22–32)
CREATININE: 1.74 mg/dL — AB (ref 0.44–1.00)
Chloride: 100 mmol/L — ABNORMAL LOW (ref 101–111)
GFR calc Af Amer: 27 mL/min — ABNORMAL LOW (ref >60)
GFR, EST NON AFRICAN AMERICAN: 23 mL/min — AB (ref >60)
Glucose, Bld: 110 mg/dL — ABNORMAL HIGH (ref 65–99)
Potassium: 3.1 mmol/L — ABNORMAL LOW (ref 3.5–5.1)
SODIUM: 135 mmol/L (ref 135–145)

## 2017-06-02 LAB — CBC
HCT: 37.4 % (ref 36.0–46.0)
Hemoglobin: 12.3 g/dL (ref 12.0–15.0)
MCH: 31.3 pg (ref 26.0–34.0)
MCHC: 32.9 g/dL (ref 30.0–36.0)
MCV: 95.2 fL (ref 78.0–100.0)
Platelets: 221 10*3/uL (ref 150–400)
RBC: 3.93 MIL/uL (ref 3.87–5.11)
RDW: 14.3 % (ref 11.5–15.5)
WBC: 6.4 10*3/uL (ref 4.0–10.5)

## 2017-06-02 LAB — URINE CULTURE

## 2017-06-02 LAB — MAGNESIUM: MAGNESIUM: 1.6 mg/dL — AB (ref 1.7–2.4)

## 2017-06-02 MED ORDER — POTASSIUM CHLORIDE 10 MEQ/100ML IV SOLN
10.0000 meq | INTRAVENOUS | Status: AC
Start: 2017-06-02 — End: 2017-06-03
  Administered 2017-06-02 (×3): 10 meq via INTRAVENOUS
  Filled 2017-06-02 (×3): qty 100

## 2017-06-02 MED ORDER — MAGNESIUM SULFATE 2 GM/50ML IV SOLN
2.0000 g | Freq: Once | INTRAVENOUS | Status: AC
  Administered 2017-06-02: 2 g via INTRAVENOUS
  Filled 2017-06-02: qty 50

## 2017-06-02 NOTE — Social Work (Signed)
CSW discussed SNF bed offers with family and family accepted [facility].  CSW confirmed with SNF.  CSW will continue to follow for disposition.  Elissa Hefty, LCSW Clinical Social Worker [phone]

## 2017-06-02 NOTE — Social Work (Signed)
CSW contacted [facility] and [facility] to see if they can offer a SNF bed.   CSW will continue to follow-up.  Elissa Hefty, LCSW Clinical Social Worker [phone]

## 2017-06-02 NOTE — Clinical Social Work Note (Signed)
Clinical Social Work Assessment  Patient Details  Name: Diana Anthony MRN: [id number] Date of Birth: [birth year]  Date of referral:  06/02/17               Reason for consult:  Facility Placement                Permission sought to share information with:  Chartered certified accountant granted to share information::  Yes, Verbal Permission Granted  Name::     Felicha Frayne  Agency::  SNF  Relationship::  son  Contact Information:     Housing/Transportation Living arrangements for the past 2 months:  [place] of Information:  Adult Children Patient Interpreter Needed:  None Criminal Activity/Legal Involvement Pertinent to Current Situation/Hospitalization:  No - Comment as needed Significant Relationships:  Adult Children, Friend, Other Family Members Lives with:  Self Do you feel safe going back to the place where you live?  No Need for family participation in patient care:  Yes (Comment)  Care giving concerns:  Pt from [facility] and is recommended for skilled nursing given new impairment. CSW met with son and daughter in law at bedside.  Family agreeable to SNF and would prefer to sent patient to [facility] or [facility].  Family indicated that before this fall, she was transitioning to assisted living facility[facility] and they prefer that facility if possible.  CSW discussed SNF options and placement.  Family had minimal questions and voiced understanding of the process.  Social Worker assessment / plan:  CSW will assist with disposition.   Employment status:  Retired Forensic scientist:  Medicare PT Recommendations:  [place] / Referral to community resources:  [place]  Patient/Family's Response to care:  Psychologist, prison and probation services of CSW f/u for SNF options and placement and voiced no questions or concerns about what CSW discussed.  Patient/Family's Understanding of and  Emotional Response to Diagnosis, Current Treatment, and Prognosis:  Patient/family has good understanding of patient's limitations and are in agreement with SNF. Family has provided CSW with two facilities that would be close to family.  Family indicated that prior to this impairment, they were working to get patient transitioned to nursing facility. Once patient completes short term rehab and returns to baseline, they will continue with getting her into a nursing facility.  No other issues or concerns identified.  Emotional Assessment Appearance:  Appears stated age Attitude/Demeanor/Rapport:  Lethargic(Resting) Affect (typically observed):  Accepting, Quiet Orientation:  Oriented to Self, Oriented to Place, Oriented to  Time, Oriented to Situation Alcohol / Substance use:  Not Applicable Psych involvement (Current and /or in the community):  No (Comment)  Discharge Needs  Concerns to be addressed:  Discharge Planning Concerns Readmission within the last 30 days:  No Current discharge risk:  Physical Impairment, Dependent with Mobility Barriers to Discharge:  No Barriers Identified   Normajean Baxter, LCSW 06/02/2017, 10:32 AM

## 2017-06-02 NOTE — NC FL2 (Signed)
[place] MEDICAID FL2 LEVEL OF CARE SCREENING TOOL     IDENTIFICATION  Patient Name: Diana Anthony Birthdate: [birth year] Sex: female Admission Date (Current Location): 05/31/2017  [facility] and [place] Number:  Herbalist and Address:  [facility] [place] [address] [place], [place] [address]      Provider Number: [id number]  Attending Physician Name and Address:  Patrecia Pour, Christean Grief, MD  Relative Name and Phone Number:       Current Level of Care: Hospital Recommended Level of Care: [place] Prior Approval Number:    Date Approved/Denied:   PASRR Number: [id number]  Discharge Plan: SNF    Current Diagnoses: Patient Active Problem List   Diagnosis Date Noted  . UTI (urinary tract infection) 05/31/2017  . Closed fracture of right superior rim of pubis ([place]) 05/30/2017  . Closed fracture of right inferior pubic ramus ([place]) 05/30/2017  . Compression fracture of L1 lumbar vertebra ([facility]) 05/30/2017  . Spondylolisthesis, lumbar region 05/30/2017  . Chronic kidney disease (CKD) stage G2/A2, mildly decreased glomerular filtration rate (GFR) between 60-89 mL/min/1.73 square meter and albuminuria creatinine ratio between 30-299 mg/g 01/31/2015  . Memory impairment 01/31/2015  . Osteoarthritis of right knee 02/24/2014  . Urinary tract infection, site not specified 12/16/2012  . Unspecified vitamin D deficiency   . Anxiety state   . History of breast cancer in female   . Hypothyroidism   . Essential hypertension   . Hyperglycemia 01/30/2009    Orientation RESPIRATION BLADDER Height & Weight     Self, Time, Situation, Place  Normal Continent Weight: 145 lb (65.8 kg) Height:  5\' 3"  (160 cm)  BEHAVIORAL SYMPTOMS/MOOD NEUROLOGICAL BOWEL NUTRITION STATUS      Continent Diet(see DC summary)  AMBULATORY STATUS COMMUNICATION OF NEEDS Skin   Extensive Assist Verbally Normal                       Personal Care Assistance  Level of Assistance  Bathing, Dressing Bathing Assistance: Maximum assistance   Dressing Assistance: Maximum assistance     Functional Limitations Info             SPECIAL CARE FACTORS FREQUENCY  PT (By licensed PT), OT (By licensed OT)     PT Frequency: 5/wk OT Frequency: 5/wk            Contractures      Additional Factors Info  Code Status, Allergies Code Status Info: DNR Allergies Info: Macrobid Nitrofurantoin Macrocrystal           Current Medications (06/02/2017):  This is the current hospital active medication list Current Facility-Administered Medications  Medication Dose Route Frequency Provider Last Rate Last Dose  . 0.45 % sodium chloride infusion   Intravenous Continuous Gala Romney L, MD 50 mL/hr at 05/31/17 2230    . acetaminophen (TYLENOL) tablet 1,000 mg  1,000 mg Oral Q6H PRN Gala Romney L, MD      . amLODipine (NORVASC) tablet 5 mg  5 mg Oral Daily Gala Romney L, MD   5 mg at 06/02/17 0835   And  . benazepril (LOTENSIN) tablet 20 mg  20 mg Oral Daily Elwyn Reach, MD   20 mg at 06/02/17 0835  . aspirin EC tablet 81 mg  81 mg Oral Daily Elwyn Reach, MD   81 mg at 06/02/17 0835  . cefTRIAXone (ROCEPHIN) 1 g in dextrose 5 % 50 mL IVPB  1 g  Intravenous Q24H Elwyn Reach, MD   Stopped at 06/02/17 0425  . heparin injection 5,000 Units  5,000 Units Subcutaneous Q8H Elwyn Reach, MD   5,000 Units at 06/02/17 0735  . hydrALAZINE (APRESOLINE) tablet 25 mg  25 mg Oral Daily Elwyn Reach, MD   25 mg at 06/02/17 0835  . levothyroxine (SYNTHROID, LEVOTHROID) tablet 100 mcg  100 mcg Oral QAC breakfast Elwyn Reach, MD   100 mcg at 06/02/17 0835  . metoprolol succinate (TOPROL-XL) 24 hr tablet 50 mg  50 mg Oral Daily Elwyn Reach, MD   50 mg at 06/02/17 0834  . ondansetron (ZOFRAN) tablet 4 mg  4 mg Oral Q6H PRN Elwyn Reach, MD       Or  . ondansetron (ZOFRAN) injection 4 mg  4 mg Intravenous Q6H PRN Gala Romney L, MD      . traMADol (ULTRAM) tablet 50 mg  50 mg Oral Q6H PRN Elwyn Reach, MD   50 mg at 06/02/17 6116     Discharge Medications: Please see discharge summary for a list of discharge medications.  Relevant Imaging Results:  Relevant Lab Results:   Additional Information SS#: [id number]  Jorge Ny, LCSW

## 2017-06-02 NOTE — Progress Notes (Signed)
PROGRESS NOTE    Diana Anthony  [id number]:[id number] DOB: 04/21/1917 DOA: 05/31/2017 PCP: Gayland Curry, DO   Brief Narrative:  Diana Anthony is a 84 y/o F with PMH significant for HTN and a fall on 05/28/17 which was seen by her PCP who found R pelvic pubis fracture and recommended non-weight bearing on the right w/ follow-up X-ray in 2 weeks, presented to the ER 3 days later after having significant pain and unable to move.  Family reported that she lives along and is not able to take care of herself, and also reported that her urine has a strong smell and have been more incontinent.  Upon ED evaluation patient was found to have grossly abnormal UA and elevated creatinine therefore patient was admitted for further treatment.   Subjective: Patient seen and examined.  She is in semi-fowler's position and denies any plan or discomfort, dizziness, fever/chill, shortness of breath, cough, chest pain, palpitation.   Assessment & Plan:  UTI - Grossly abnormal UA - E-coli found > 100K in UA - Continues ceftriaxone for 24 hours - Continue IVF  Status post mechanical fall/fracture of the superior ramus of the right pubis - Seen by orthopedic surgery as an outpatient and recommended nonweightbearing on the right, may weight-bear on the left to pivot to transfer  AKI on CKD stage III - Baseline creatine around 1.5 - Treated with IVF with improvement   - Creatine 1.74 (from 2.1 on 05/31/17 )  - BUN 26 (from 34 on 05/31/17) - Continues IVF - Will order BMP to creatine in the morning  Essential Hypertension - BP stable - Continue home medications   Hypokalemia - Replete - Will order BMP to monitor potassium level   DVT prophylaxis: Lovonox Code Status: DNR Family Communication: Family at bedside Disposition Plan: SNF when bed available   Consultants:   None  Procedures: Antimicrobials: Anti-infectives (From admission, onward)   Start     Dose/Rate Route Frequency Ordered Stop   06/01/17 2000  cefTRIAXone (ROCEPHIN) 1 g in dextrose 5 % 50 mL IVPB     1 g 100 mL/hr over 30 Minutes Intravenous Every 24 hours 05/31/17 2138     05/31/17 2030  cefTRIAXone (ROCEPHIN) 1 g in dextrose 5 % 50 mL IVPB     1 g 100 mL/hr over 30 Minutes Intravenous  Once 05/31/17 2027 05/31/17 2129      Objective: Vitals:   06/01/17 0538 06/01/17 1300 06/01/17 2207 06/02/17 0404  BP: (!) 164/79 (!) 159/77 (!) 155/82 (!) 160/91  Pulse: 69 70 75 (!) 122  Resp:  18 18 18   Temp: 98.8 F (37.1 C) 98.8 F (37.1 C) 99.1 F (37.3 C) 98.6 F (37 C)  TempSrc: Oral Oral Oral Oral  SpO2: 94% 95% 95% 94%  Weight:      Height:        Intake/Output Summary (Last 24 hours) at 06/02/2017 1113 Last data filed at 06/02/2017 0900 Gross per 24 hour  Intake 720 ml  Output 1600 ml  Net -880 ml   Filed Weights   05/31/17 2011  Weight: 65.8 kg (145 lb)    Examination:  General exam: Appears calm and comfortable  Respiratory system: Clear to auscultation. Respiratory effort normal. No wheezing or crackle Cardiovascular system: S1 & S2 heard, RRR.  No pedal edema. Gastrointestinal system: Abdomen is nondistended, soft and nontender.  Central nervous system: Alert and oriented. No focal neurological deficits. Skin: No rashes, lesions or ulcers  Data Reviewed: I have personally reviewed following labs and imaging studies  CBC: Recent Labs  Lab 05/30/17 1004 05/31/17 1735 06/01/17 0641 06/02/17 0731  WBC 9.6 8.5 7.3 6.4  NEUTROABS 7,373 6.6  --   --   HGB 12.2 12.1 10.7* 12.3  HCT 35.8 36.6 32.5* 37.4  MCV 92.0 94.6 94.2 95.2  PLT 186 192 198 314   Basic Metabolic Panel: Recent Labs  Lab 05/30/17 1004 05/31/17 1735 06/01/17 0641 06/02/17 0731  NA 136 136 137 135  K 4.2 3.2* 2.9* 3.1*  CL 99 102 102 100*  CO2 25 22 22 22   GLUCOSE 116* 136* 105* 110*  BUN 32* 34* 29* 26*  CREATININE 2.01* 2.10* 1.78* 1.74*  CALCIUM 9.0 8.7* 8.2* 8.5*  MG  --   --  1.7 1.6*    GFR: Estimated Creatinine Clearance: 16.5 mL/min (A) (by C-G formula based on SCr of 1.74 mg/dL (H)). Liver Function Tests: Recent Labs  Lab 05/31/17 1735 06/01/17 0641  AST 49* 37  ALT 22 18  ALKPHOS 107 85  BILITOT 0.8 1.0  PROT 6.8 6.4*  ALBUMIN 3.1* 2.7*   No results for input(s): LIPASE, AMYLASE in the last 168 hours. No results for input(s): AMMONIA in the last 168 hours. Coagulation Profile: No results for input(s): INR, PROTIME in the last 168 hours. Cardiac Enzymes: No results for input(s): CKTOTAL, CKMB, CKMBINDEX, TROPONINI in the last 168 hours. BNP (last 3 results) No results for input(s): PROBNP in the last 8760 hours. HbA1C: No results for input(s): HGBA1C in the last 72 hours. CBG: No results for input(s): GLUCAP in the last 168 hours. Lipid Profile: No results for input(s): CHOL, HDL, LDLCALC, TRIG, CHOLHDL, LDLDIRECT in the last 72 hours. Thyroid Function Tests: No results for input(s): TSH, T4TOTAL, FREET4, T3FREE, THYROIDAB in the last 72 hours. Anemia Panel: No results for input(s): VITAMINB12, FOLATE, FERRITIN, TIBC, IRON, RETICCTPCT in the last 72 hours. Sepsis Labs: No results for input(s): PROCALCITON, LATICACIDVEN in the last 168 hours.  Recent Results (from the past 240 hour(s))  Urine culture     Status: Abnormal   Collection Time: 05/31/17  7:25 PM  Result Value Ref Range Status   Specimen Description URINE, CLEAN CATCH  Final   Special Requests NONE  Final   Culture >=100,000 COLONIES/mL ESCHERICHIA COLI (A)  Final   Report Status 06/02/2017 FINAL  Final   Organism ID, Bacteria ESCHERICHIA COLI (A)  Final      Susceptibility   Escherichia coli - MIC*    AMPICILLIN <=2 SENSITIVE Sensitive     CEFAZOLIN <=4 SENSITIVE Sensitive     CEFTRIAXONE <=1 SENSITIVE Sensitive     CIPROFLOXACIN <=0.25 SENSITIVE Sensitive     GENTAMICIN <=1 SENSITIVE Sensitive     IMIPENEM <=0.25 SENSITIVE Sensitive     NITROFURANTOIN <=16 SENSITIVE Sensitive      TRIMETH/SULFA <=20 SENSITIVE Sensitive     AMPICILLIN/SULBACTAM <=2 SENSITIVE Sensitive     PIP/TAZO <=4 SENSITIVE Sensitive     Extended ESBL NEGATIVE Sensitive     * >=100,000 COLONIES/mL ESCHERICHIA COLI         Radiology Studies: Dg Chest 1 View  Result Date: 05/31/2017 CLINICAL DATA:  Status post fall on Tuesday with intense pain. EXAM: CHEST 1 VIEW COMPARISON:  January 8 29 FINDINGS: The heart size and mediastinal contours are stable. The heart size is enlarged. The aorta is tortuous. There is no focal infiltrate, pulmonary edema, or pleural effusion. The visualized skeletal structures  are stable. IMPRESSION: No active cardiopulmonary disease. Electronically Signed   By: Abelardo Diesel M.D.   On: 05/31/2017 18:28   Dg Hip Unilat W Or Wo Pelvis 2-3 Views Right  Result Date: 05/31/2017 CLINICAL DATA:  Status post fall on Tuesday with intense pain of the right hip. EXAM: DG HIP (WITH OR WITHOUT PELVIS) 2-3V RIGHT COMPARISON:  Pelvic x-ray May 30, 2017 FINDINGS: There are fractures at the right superior and inferior pubic rami unchanged compared prior exam. No other acute fracture or dislocation is identified. IMPRESSION: Fractures of the right superior and inferior pubic rami unchanged compared prior pelvic x-ray of May 30, 2017. Electronically Signed   By: Abelardo Diesel M.D.   On: 05/31/2017 18:26        Scheduled Meds: . amLODipine  5 mg Oral Daily   And  . benazepril  20 mg Oral Daily  . aspirin EC  81 mg Oral Daily  . heparin  5,000 Units Subcutaneous Q8H  . hydrALAZINE  25 mg Oral Daily  . levothyroxine  100 mcg Oral QAC breakfast  . metoprolol succinate  50 mg Oral Daily   Continuous Infusions: . sodium chloride 50 mL/hr at 05/31/17 2230  . cefTRIAXone (ROCEPHIN)  IV Stopped (06/02/17 0425)     LOS: 2 days    Diana Elta [place]) Mervyn Skeeters, PA-S

## 2017-06-03 DIAGNOSIS — R2681 Unsteadiness on feet: Secondary | ICD-10-CM | POA: Diagnosis not present

## 2017-06-03 DIAGNOSIS — M25559 Pain in unspecified hip: Secondary | ICD-10-CM | POA: Diagnosis not present

## 2017-06-03 DIAGNOSIS — S32591D Other specified fracture of right pubis, subsequent encounter for fracture with routine healing: Secondary | ICD-10-CM

## 2017-06-03 DIAGNOSIS — N390 Urinary tract infection, site not specified: Principal | ICD-10-CM

## 2017-06-03 DIAGNOSIS — S329XXA Fracture of unspecified parts of lumbosacral spine and pelvis, initial encounter for closed fracture: Secondary | ICD-10-CM | POA: Diagnosis not present

## 2017-06-03 DIAGNOSIS — N182 Chronic kidney disease, stage 2 (mild): Secondary | ICD-10-CM | POA: Diagnosis not present

## 2017-06-03 DIAGNOSIS — I10 Essential (primary) hypertension: Secondary | ICD-10-CM | POA: Diagnosis not present

## 2017-06-03 DIAGNOSIS — F341 Dysthymic disorder: Secondary | ICD-10-CM | POA: Diagnosis not present

## 2017-06-03 DIAGNOSIS — M79604 Pain in right leg: Secondary | ICD-10-CM | POA: Diagnosis not present

## 2017-06-03 DIAGNOSIS — R278 Other lack of coordination: Secondary | ICD-10-CM | POA: Diagnosis not present

## 2017-06-03 DIAGNOSIS — W19XXXD Unspecified fall, subsequent encounter: Secondary | ICD-10-CM | POA: Diagnosis not present

## 2017-06-03 DIAGNOSIS — Z4789 Encounter for other orthopedic aftercare: Secondary | ICD-10-CM | POA: Diagnosis not present

## 2017-06-03 DIAGNOSIS — M6281 Muscle weakness (generalized): Secondary | ICD-10-CM | POA: Diagnosis not present

## 2017-06-03 DIAGNOSIS — S92153A Displaced avulsion fracture (chip fracture) of unspecified talus, initial encounter for closed fracture: Secondary | ICD-10-CM | POA: Diagnosis not present

## 2017-06-03 DIAGNOSIS — B9620 Unspecified Escherichia coli [E. coli] as the cause of diseases classified elsewhere: Secondary | ICD-10-CM | POA: Diagnosis not present

## 2017-06-03 DIAGNOSIS — F411 Generalized anxiety disorder: Secondary | ICD-10-CM | POA: Diagnosis not present

## 2017-06-03 DIAGNOSIS — G8911 Acute pain due to trauma: Secondary | ICD-10-CM | POA: Diagnosis not present

## 2017-06-03 DIAGNOSIS — R41841 Cognitive communication deficit: Secondary | ICD-10-CM | POA: Diagnosis not present

## 2017-06-03 DIAGNOSIS — E039 Hypothyroidism, unspecified: Secondary | ICD-10-CM | POA: Diagnosis not present

## 2017-06-03 DIAGNOSIS — S32511D Fracture of superior rim of right pubis, subsequent encounter for fracture with routine healing: Secondary | ICD-10-CM | POA: Diagnosis not present

## 2017-06-03 LAB — BASIC METABOLIC PANEL
ANION GAP: 10 (ref 5–15)
BUN: 31 mg/dL — ABNORMAL HIGH (ref 6–20)
CALCIUM: 8.5 mg/dL — AB (ref 8.9–10.3)
CO2: 22 mmol/L (ref 22–32)
CREATININE: 1.81 mg/dL — AB (ref 0.44–1.00)
Chloride: 100 mmol/L — ABNORMAL LOW (ref 101–111)
GFR calc Af Amer: 26 mL/min — ABNORMAL LOW (ref >60)
GFR, EST NON AFRICAN AMERICAN: 22 mL/min — AB (ref >60)
Glucose, Bld: 117 mg/dL — ABNORMAL HIGH (ref 65–99)
Potassium: 3.8 mmol/L (ref 3.5–5.1)
SODIUM: 132 mmol/L — AB (ref 135–145)

## 2017-06-03 LAB — MAGNESIUM: Magnesium: 2 mg/dL (ref 1.7–2.4)

## 2017-06-03 MED ORDER — TRAMADOL HCL 50 MG PO TABS: ORAL_TABLET | ORAL | 0 refills | Status: AC

## 2017-06-03 MED ORDER — CEPHALEXIN 250 MG PO CAPS: 250.0000 mg | ORAL_CAPSULE | Freq: Two times a day (BID) | ORAL | 0 refills | Status: AC

## 2017-06-03 NOTE — Plan of Care (Signed)
  Progressing Education: Knowledge of General Education information will improve 06/03/2017 1207 - Progressing by Rance Muir, RN Health Behavior/Discharge Planning: Ability to manage health-related needs will improve 06/03/2017 1207 - Progressing by Rance Muir, RN Clinical Measurements: Ability to maintain clinical measurements within normal limits will improve 06/03/2017 1207 - Progressing by Rance Muir, RN Will remain free from infection 06/03/2017 1207 - Progressing by Rance Muir, RN Diagnostic test results will improve 06/03/2017 1207 - Progressing by Rance Muir, RN Respiratory complications will improve 06/03/2017 1207 - Progressing by Rance Muir, RN Cardiovascular complication will be avoided 06/03/2017 1207 - Progressing by Rance Muir, RN Activity: Risk for activity intolerance will decrease 06/03/2017 1207 - Progressing by Rance Muir, RN Nutrition: Adequate nutrition will be maintained 06/03/2017 1207 - Progressing by Rance Muir, RN Coping: Level of anxiety will decrease 06/03/2017 1207 - Progressing by Rance Muir, RN Elimination: Will not experience complications related to bowel motility 06/03/2017 1207 - Progressing by Rance Muir, RN Will not experience complications related to urinary retention 06/03/2017 1207 - Progressing by Rance Muir, RN Pain Managment: General experience of comfort will improve 06/03/2017 1207 - Progressing by Rance Muir, RN Safety: Ability to remain free from injury will improve 06/03/2017 1207 - Progressing by Rance Muir, RN Skin Integrity: Risk for impaired skin integrity will decrease 06/03/2017 1207 - Progressing by Rance Muir, RN

## 2017-06-03 NOTE — Discharge Summary (Signed)
Physician Discharge Summary  Diana Anthony  KVQ:[id number]  DOB: [birth year]  DOA: 05/31/2017 PCP: Gayland Curry, DO  Admit date: 05/31/2017 Discharge date: 06/03/2017  Admitted From: Home  Disposition:  SNF   Recommendations for Outpatient Follow-up:  1. Follow up with SNF provider at earliest convenience  2. Please obtain BMP/CBC in one week to monitor Hgb and Cr  3. Please obtain pelvic x ray in 1 week to re-evaluate for pelvic fracture  4. Continue abx for 6 more days  5. Follow up with orthopedic surgery in 1 week - [place]   Discharge Condition: Stable  CODE STATUS: DNR  Diet recommendation: Heart Healthy   Brief/Interim Summary: For full details see H&P/progress notes but in brief, Diana Anthony is a 84 y/o F with PMHx of HTN who presented to the ED after a mechanical fall. She was found to have a pelvic fracture and UTI, therefore was admitted for further management. Orthopedic surgery recommended conservative treatment for now. UTI was treated with Ceftriaxone. Urine cultures grew E. Coli pansenstive antibiotic de-escalated to keflex. Patient was evaluated by PT recommending STR. Patient deemed stable for discharge     Subjective: Patient seen and examined, no new complaints. Patient remains afebrile. Tolerating diet well and ambulating with PT with no issues.   Discharge Diagnoses/Hospital Course:  UTI-E. coli Urine culture grew E. coli Continue ceftriaxone, transitioned to oral Keflex 250 mg q 12 x 6 days dose adjusted per CrCl   Status post mechanical fall/fracture of the superior ramus of the right pubis Seen by orthopedic surgery as an outpatient and recommended nonweightbearing on the right, may weight-bear on the left topivoto transfer Follow up in 1 week to repeat pelvic xray   AKI on CKD stage III Baseline creatinine around 1.5 Treated with IV fluid, creatinine improved, upon discharge 1.8 - may be new baseline  Encourage oral hydration    Hypertension BP stable Continue home medications with no changes   Hypokalemia/hypomagnesemia Resolved  Check BMP in 1 week   All other chronic medical condition were stable during the hospitalization.  Patient was seen by physical therapy, rec's STR @ SNF  On the day of the discharge the patient's vitals were stable, and no other acute medical condition were reported by patient. the patient was felt safe to be discharge to SNF   Discharge Instructions  You were cared for by a hospitalist during your hospital stay. If you have any questions about your discharge medications or the care you received while you were in the hospital after you are discharged, you can call the unit and asked to speak with the hospitalist on call if the hospitalist that took care of you is not available. Once you are discharged, your primary care physician will handle any further medical issues. Please note that NO REFILLS for any discharge medications will be authorized once you are discharged, as it is imperative that you return to your primary care physician (or establish a relationship with a primary care physician if you do not have one) for your aftercare needs so that they can reassess your need for medications and monitor your lab values.  Discharge Instructions    Call MD for:  difficulty breathing, headache or visual disturbances   Complete by:  As directed    Call MD for:  extreme fatigue   Complete by:  As directed    Call MD for:  hives   Complete by:  As directed    Call  MD for:  persistant dizziness or light-headedness   Complete by:  As directed    Call MD for:  persistant nausea and vomiting   Complete by:  As directed    Call MD for:  redness, tenderness, or signs of infection (pain, swelling, redness, odor or green/yellow discharge around incision site)   Complete by:  As directed    Call MD for:  severe uncontrolled pain   Complete by:  As directed    Call MD for:  temperature >100.4    Complete by:  As directed    Diet - low sodium heart healthy   Complete by:  As directed    Increase activity slowly   Complete by:  As directed      Allergies as of 06/03/2017      Reactions   Macrobid [nitrofurantoin Macrocrystal] Other (See Comments)   unknown      Medication List    STOP taking these medications   chlordiazePOXIDE 10 MG capsule Commonly known as:  LIBRIUM     TAKE these medications   acetaminophen 500 MG tablet Commonly known as:  TYLENOL Take 2 tablets (1,000 mg total) by mouth every 6 (six) hours as needed. What changed:  reasons to take this   amLODipine-benazepril 10-20 MG capsule Commonly known as:  LOTREL TAKE 1 CAPSULE BY MOUTH DAILY   aspirin EC 81 MG tablet Take 1 tablet (81 mg total) by mouth daily.   cephALEXin 250 MG capsule Commonly known as:  KEFLEX Take 1 capsule (250 mg total) by mouth 2 (two) times daily for 6 days.   hydrALAZINE 25 MG tablet Commonly known as:  APRESOLINE Take 1 tablet (25 mg total) by mouth daily.   levothyroxine 100 MCG tablet Commonly known as:  SYNTHROID, LEVOTHROID Take 1 tablet (100 mcg total) by mouth daily.   metoprolol succinate 50 MG 24 hr tablet Commonly known as:  TOPROL-XL TAKE 1 TABLET BY MOUTH DAILY TO CONTROL BLOOD PRESSURE AND HEART RHYTHM.(TAKE WITH OR IMMEDIATELY FOLLOWING A MEAL) What changed:  See the new instructions.   traMADol 50 MG tablet Commonly known as:  ULTRAM 2 tablets every 6 hours as needed for pain.  You may take with Tylenol.       Contact information for follow-up providers    Suzan Slick, NP. Schedule an appointment as soon as possible for a visit in 1 week(s).   Specialty:  Orthopedic Surgery Why:  Pelvic fracture f/u  Contact information: [address] [place] [address] [phone]            Contact information for after-discharge care    Destination    [facility] Follow up.   Service:  Skilled Nursing Contact  information: [place] [phone]                 Allergies  Allergen Reactions  . Macrobid [Nitrofurantoin Macrocrystal] Other (See Comments)    unknown    Consultations:  None    Procedures/Studies: Dg Chest 1 View  Result Date: 05/31/2017 CLINICAL DATA:  Status post fall on Tuesday with intense pain. EXAM: CHEST 1 VIEW COMPARISON:  January 8 29 FINDINGS: The heart size and mediastinal contours are stable. The heart size is enlarged. The aorta is tortuous. There is no focal infiltrate, pulmonary edema, or pleural effusion. The visualized skeletal structures are stable. IMPRESSION: No active cardiopulmonary disease. Electronically Signed   By: Mallie Darting.D.  On: 05/31/2017 18:28   Dg Chest 2 View  Result Date: 05/27/2017 CLINICAL DATA:  Chest pain after fall. EXAM: CHEST  2 VIEW COMPARISON:  10/18/2016 FINDINGS: Stable cardiomegaly with mild uncoiling of the thoracic aorta. There is aortic atherosclerosis. Mild pulmonary vascular congestion is noted without pneumonic consolidation. Minimal atelectasis is seen at the lung bases. Trace left effusion. No pneumothorax. No acute fracture of the bony thorax. High-riding humeral heads compatible with chronic rotator cuff tears, stable in appearance. Osteoarthritis of the [facility] and glenohumeral joints. IMPRESSION: 1. Stable cardiomegaly with mild pulmonary vascular congestion. 2. Bibasilar atelectasis. 3. Aortic atherosclerosis. 4. No acute osseous abnormality Electronically Signed   By: Ashley Royalty M.D.   On: 05/27/2017 22:06   Dg Lumbar Spine Complete  Result Date: 05/30/2017 CLINICAL DATA:  Fall. EXAM: LUMBAR SPINE - COMPLETE 4+ VIEW COMPARISON:  Chest x-ray 05/27/2017. FINDINGS: Soft tissue structures are unremarkable. Stool noted throughout the colon. No bowel distention. Osteopenia. Mild scoliosis lumbar spine. Degenerative changes lumbar spine and both hips. 9 mm anterolisthesis  L4 on L5. Minimal L1 compression. Mild lower thoracic vertebral body compressions cannot be excluded. IMPRESSION: 1. Osteopenia and mild scoliosis lumbar spine. Diffuse osteopenia multilevel degenerative change. 9 mm anterolisthesis L4 on L5. 2. Mild L1 compression cannot be excluded. Multiple mild lower thoracic vertebral body compressions cannot be excluded. These changes may be old. Electronically Signed   By: Marcello Moores  Register   On: 05/30/2017 11:01   Dg Pelvis 1-2 Views  Result Date: 05/30/2017 CLINICAL DATA:  Fall 3 days ago, pain EXAM: PELVIS - 1-2 VIEW COMPARISON:  None. FINDINGS: Fractures are noted through the inferior and superior pubic rami on the right. The superior pubic ramus fracture is near the ischium and medial acetabulum. No proximal femoral fracture. No subluxation or dislocation. IMPRESSION: Right superior and inferior pubic rami fractures. Electronically Signed   By: Rolm Baptise M.D.   On: 05/30/2017 10:58   Dg Hip Unilat W Or Wo Pelvis 2-3 Views Right  Result Date: 05/31/2017 CLINICAL DATA:  Status post fall on Tuesday with intense pain of the right hip. EXAM: DG HIP (WITH OR WITHOUT PELVIS) 2-3V RIGHT COMPARISON:  Pelvic x-ray May 30, 2017 FINDINGS: There are fractures at the right superior and inferior pubic rami unchanged compared prior exam. No other acute fracture or dislocation is identified. IMPRESSION: Fractures of the right superior and inferior pubic rami unchanged compared prior pelvic x-ray of May 30, 2017. Electronically Signed   By: Abelardo Diesel M.D.   On: 05/31/2017 18:26    Discharge Exam: Vitals:   06/02/17 2056 06/03/17 0546  BP: (!) 141/87   Pulse: (!) 117 (!) 110  Resp: 18 17  Temp: 97.9 F (36.6 C) 98.2 F (36.8 C)  SpO2: 96% 95%   Vitals:   06/02/17 0404 06/02/17 1300 06/02/17 2056 06/03/17 0546  BP: (!) 160/91 131/89 (!) 141/87   Pulse: (!) 122 (!) 124 (!) 117 (!) 110  Resp: 18 18 18 17   Temp: 98.6 F (37 C) 98.1 F (36.7 C) 97.9 F  (36.6 C) 98.2 F (36.8 C)  TempSrc: Oral Oral Oral Oral  SpO2: 94% 94% 96% 95%  Weight:      Height:        General: Pt is alert, awake, not in acute distress Cardiovascular: RRR, S1/S2 +, no rubs, no gallops Respiratory: CTA bilaterally, no wheezing, no rhonchi Abdominal: Soft, NT, ND, bowel sounds + Extremities: no edema, no cyanosis    The results  of significant diagnostics from this hospitalization (including imaging, microbiology, ancillary and laboratory) are listed below for reference.     Microbiology: Recent Results (from the past 240 hour(s))  Urine culture     Status: Abnormal   Collection Time: 05/31/17  7:25 PM  Result Value Ref Range Status   Specimen Description URINE, CLEAN CATCH  Final   Special Requests NONE  Final   Culture >=100,000 COLONIES/mL ESCHERICHIA COLI (A)  Final   Report Status 06/02/2017 FINAL  Final   Organism ID, Bacteria ESCHERICHIA COLI (A)  Final      Susceptibility   Escherichia coli - MIC*    AMPICILLIN <=2 SENSITIVE Sensitive     CEFAZOLIN <=4 SENSITIVE Sensitive     CEFTRIAXONE <=1 SENSITIVE Sensitive     CIPROFLOXACIN <=0.25 SENSITIVE Sensitive     GENTAMICIN <=1 SENSITIVE Sensitive     IMIPENEM <=0.25 SENSITIVE Sensitive     NITROFURANTOIN <=16 SENSITIVE Sensitive     TRIMETH/SULFA <=20 SENSITIVE Sensitive     AMPICILLIN/SULBACTAM <=2 SENSITIVE Sensitive     PIP/TAZO <=4 SENSITIVE Sensitive     Extended ESBL NEGATIVE Sensitive     * >=100,000 COLONIES/mL ESCHERICHIA COLI     Labs: BNP (last 3 results) No results for input(s): BNP in the last 8760 hours. Basic Metabolic Panel: Recent Labs  Lab 05/30/17 1004 05/31/17 1735 06/01/17 0641 06/02/17 0731 06/03/17 0700  NA 136 136 137 135 132*  K 4.2 3.2* 2.9* 3.1* 3.8  CL 99 102 102 100* 100*  CO2 25 22 22 22 22   GLUCOSE 116* 136* 105* 110* 117*  BUN 32* 34* 29* 26* 31*  CREATININE 2.01* 2.10* 1.78* 1.74* 1.81*  CALCIUM 9.0 8.7* 8.2* 8.5* 8.5*  MG  --   --  1.7 1.6*  2.0   Liver Function Tests: Recent Labs  Lab 05/31/17 1735 06/01/17 0641  AST 49* 37  ALT 22 18  ALKPHOS 107 85  BILITOT 0.8 1.0  PROT 6.8 6.4*  ALBUMIN 3.1* 2.7*   No results for input(s): LIPASE, AMYLASE in the last 168 hours. No results for input(s): AMMONIA in the last 168 hours. CBC: Recent Labs  Lab 05/30/17 1004 05/31/17 1735 06/01/17 0641 06/02/17 0731  WBC 9.6 8.5 7.3 6.4  NEUTROABS 7,373 6.6  --   --   HGB 12.2 12.1 10.7* 12.3  HCT 35.8 36.6 32.5* 37.4  MCV 92.0 94.6 94.2 95.2  PLT 186 192 198 221   Cardiac Enzymes: No results for input(s): CKTOTAL, CKMB, CKMBINDEX, TROPONINI in the last 168 hours. BNP: Invalid input(s): POCBNP CBG: No results for input(s): GLUCAP in the last 168 hours. D-Dimer No results for input(s): DDIMER in the last 72 hours. Hgb A1c No results for input(s): HGBA1C in the last 72 hours. Lipid Profile No results for input(s): CHOL, HDL, LDLCALC, TRIG, CHOLHDL, LDLDIRECT in the last 72 hours. Thyroid function studies No results for input(s): TSH, T4TOTAL, T3FREE, THYROIDAB in the last 72 hours.  Invalid input(s): FREET3 Anemia work up No results for input(s): VITAMINB12, FOLATE, FERRITIN, TIBC, IRON, RETICCTPCT in the last 72 hours. Urinalysis    Component Value Date/Time   COLORURINE YELLOW 05/31/2017 1925   APPEARANCEUR CLEAR 05/31/2017 1925   LABSPEC 1.016 05/31/2017 1925   PHURINE 5.0 05/31/2017 1925   GLUCOSEU NEGATIVE 05/31/2017 1925   HGBUR SMALL (A) 05/31/2017 1925   BILIRUBINUR NEGATIVE 05/31/2017 1925   BILIRUBINUR negative 12/16/2012 [place] 05/31/2017 1925   PROTEINUR 100 (A) 05/31/2017 1925  UROBILINOGEN negative 12/16/2012 1449   NITRITE NEGATIVE 05/31/2017 1925   LEUKOCYTESUR TRACE (A) 05/31/2017 1925   Sepsis Labs Invalid input(s): PROCALCITONIN,  WBC,  LACTICIDVEN Microbiology Recent Results (from the past 240 hour(s))  Urine culture     Status: Abnormal   Collection Time: 05/31/17   7:25 PM  Result Value Ref Range Status   Specimen Description URINE, CLEAN CATCH  Final   Special Requests NONE  Final   Culture >=100,000 COLONIES/mL ESCHERICHIA COLI (A)  Final   Report Status 06/02/2017 FINAL  Final   Organism ID, Bacteria ESCHERICHIA COLI (A)  Final      Susceptibility   Escherichia coli - MIC*    AMPICILLIN <=2 SENSITIVE Sensitive     CEFAZOLIN <=4 SENSITIVE Sensitive     CEFTRIAXONE <=1 SENSITIVE Sensitive     CIPROFLOXACIN <=0.25 SENSITIVE Sensitive     GENTAMICIN <=1 SENSITIVE Sensitive     IMIPENEM <=0.25 SENSITIVE Sensitive     NITROFURANTOIN <=16 SENSITIVE Sensitive     TRIMETH/SULFA <=20 SENSITIVE Sensitive     AMPICILLIN/SULBACTAM <=2 SENSITIVE Sensitive     PIP/TAZO <=4 SENSITIVE Sensitive     Extended ESBL NEGATIVE Sensitive     * >=100,000 COLONIES/mL ESCHERICHIA COLI     Time coordinating discharge: 32 minutes  SIGNED:  Chipper Oman, MD  [facility] 06/03/2017, 10:24 AM  Pager please text page via  [email or web address]

## 2017-06-03 NOTE — Progress Notes (Signed)
Report called to SNF at this time.

## 2017-06-03 NOTE — Clinical Social Work Placement (Signed)
   CLINICAL SOCIAL WORK PLACEMENT  NOTE  Date:  06/03/2017  Patient Details  Name: Diana Anthony MRN: [id number] Date of Birth: [birth year]  Clinical Social Work is seeking post-discharge placement for this patient at the [place] level of care (*CSW will initial, date and re-position this form in  chart as items are completed):  Yes   Patient/family provided with [place] Work Department's list of facilities offering this level of care within the geographic area requested by the patient (or if unable, by the patient's family).  Yes   Patient/family informed of their freedom to choose among providers that offer the needed level of care, that participate in Medicare, Medicaid or managed care program needed by the patient, have an available bed and are willing to accept the patient.  Yes   Patient/family informed of [place]'s ownership interest in [facility] and [facility], as well as of the fact that they are under no obligation to receive care at these facilities.  PASRR submitted to EDS on       PASRR number received on 06/02/17     Existing PASRR number confirmed on       FL2 transmitted to all facilities in geographic area requested by pt/family on 06/02/17     FL2 transmitted to all facilities within larger geographic area on       Patient informed that his/her managed care company has contracts with or will negotiate with certain facilities, including the following:        Yes   Patient/family informed of bed offers received.  Patient chooses bed at       Physician recommends and patient chooses bed at      Patient to be transferred to   on 06/02/17.  Patient to be transferred to facility by [facility]     Patient family notified on 06/03/17 of transfer.  Name of family member notified:  son advised of transport     PHYSICIAN       Additional Comment:    _______________________________________________ Normajean Baxter,  LCSW 06/03/2017, 9:50 AM

## 2017-06-03 NOTE — Social Work (Signed)
Clinical Social Worker facilitated patient discharge including contacting patient family and facility to confirm patient discharge plans.  Clinical information faxed to facility and family agreeable with plan.    CSW arranged ambulance transport via [facility] to [facility]    RN to call [phone] to give report prior to discharge. Pt going to Room 104B.  Clinical Social Worker will sign off for now as social work intervention is no longer needed. Please consult [place] again if new need arises.  Elissa Hefty, LCSW Clinical Social Worker [phone]

## 2017-06-05 DIAGNOSIS — F341 Dysthymic disorder: Secondary | ICD-10-CM | POA: Diagnosis not present

## 2017-06-05 DIAGNOSIS — E039 Hypothyroidism, unspecified: Secondary | ICD-10-CM | POA: Diagnosis not present

## 2017-06-05 DIAGNOSIS — F411 Generalized anxiety disorder: Secondary | ICD-10-CM | POA: Diagnosis not present

## 2017-06-05 DIAGNOSIS — S329XXA Fracture of unspecified parts of lumbosacral spine and pelvis, initial encounter for closed fracture: Secondary | ICD-10-CM | POA: Diagnosis not present

## 2017-06-05 DIAGNOSIS — I10 Essential (primary) hypertension: Secondary | ICD-10-CM | POA: Diagnosis not present

## 2017-06-05 DIAGNOSIS — N390 Urinary tract infection, site not specified: Secondary | ICD-10-CM | POA: Diagnosis not present

## 2017-06-10 ENCOUNTER — Other Ambulatory Visit: Payer: Self-pay | Admitting: Internal Medicine

## 2017-06-10 DIAGNOSIS — I10 Essential (primary) hypertension: Secondary | ICD-10-CM

## 2017-06-13 ENCOUNTER — Ambulatory Visit (INDEPENDENT_AMBULATORY_CARE_PROVIDER_SITE_OTHER): Payer: Medicare Other | Admitting: Orthopedic Surgery

## 2017-06-13 ENCOUNTER — Ambulatory Visit (INDEPENDENT_AMBULATORY_CARE_PROVIDER_SITE_OTHER): Payer: Medicare Other

## 2017-06-13 ENCOUNTER — Encounter (INDEPENDENT_AMBULATORY_CARE_PROVIDER_SITE_OTHER): Payer: Self-pay | Admitting: Orthopedic Surgery

## 2017-06-13 DIAGNOSIS — S32511D Fracture of superior rim of right pubis, subsequent encounter for fracture with routine healing: Secondary | ICD-10-CM | POA: Diagnosis not present

## 2017-06-13 NOTE — Progress Notes (Signed)
Office Visit Note   Patient: Diana Anthony           Date of Birth: 01/27/1917           MRN: [id number] Visit Date: 06/13/2017 Requested by: Gayland Curry, DO [place], [place] [address] PCP: Gayland Curry, DO  Subjective: Chief Complaint  Patient presents with  . Pelvis - Fracture, Follow-up    HPI: Patient presents for follow-up of right hip pain.  2 weeks out from her injury.  She describes some pain in the right groin.  Nonweightbearing.  She is at a skilled nursing facility.  Date of injury January 8.              ROS: All systems reviewed are negative as they relate to the chief complaint within the history of present illness.  Patient denies  fevers or chills.   Assessment & Plan: Visit Diagnoses:  1. Closed fracture of superior ramus of right pubis with routine healing, subsequent encounter     Plan: Impression is closed fracture superior and inferior pubic ramus with some extension into the acetabulum.  There is been no displacement but not reveal any evidence of fracture callus.  She has some pain with physical examination.  Plan is to continue nonweightbearing for 2 more weeks with repeat radiographs on return.  Could likely start some weightbearing at that time.  Follow-Up Instructions: Return in about 17 days (around 06/30/2017).   Orders:  Orders Placed This Encounter  Procedures  . XR Pelvis 1-2 Views   No orders of the defined types were placed in this encounter.     Procedures: No procedures performed   Clinical Data: No additional findings.  Objective: Vital Signs: There were no vitals taken for this visit.  Physical Exam:   Constitutional: Patient appears well-developed HEENT:  Head: Normocephalic Eyes:EOM are normal Neck: Normal range of motion Cardiovascular: Normal rate Pulmonary/chest: Effort normal Neurologic: Patient is alert Skin: Skin is warm Psychiatric: Patient has normal mood and affect    Ortho Exam:  Orthopedic exam demonstrates some pain with right hip range of motion but it is mild.  No pain with left hip range of motion.  Ankle dorsiflexion and plantar flexion strength is intact.  No other masses lymphadenopathy or skin changes noted in the pelvic region.  Specialty Comments:  No specialty comments available.  Imaging: Xr Pelvis 1-2 Views  Result Date: 06/13/2017 AP pelvis demonstrates right-sided rami fractures of the inferior and superior rami.  Superior rami fracture extends close to the acetabulum.  No evidence of callus formation is present.  2 mm of displacement is noted at the superior rami fracture.    PMFS History: Patient Active Problem List   Diagnosis Date Noted  . UTI (urinary tract infection) 05/31/2017  . Closed fracture of right superior rim of pubis ([place]) 05/30/2017  . Closed fracture of right inferior pubic ramus ([place]) 05/30/2017  . Compression fracture of L1 lumbar vertebra (HCC) 05/30/2017  . Spondylolisthesis, lumbar region 05/30/2017  . Chronic kidney disease (CKD) stage G2/A2, mildly decreased glomerular filtration rate (GFR) between 60-89 mL/min/1.73 square meter and albuminuria creatinine ratio between 30-299 mg/g 01/31/2015  . Memory impairment 01/31/2015  . Osteoarthritis of right knee 02/24/2014  . Urinary tract infection, site not specified 12/16/2012  . Unspecified vitamin D deficiency   . Anxiety state   . History of breast cancer in female   . Hypothyroidism   . Essential hypertension   .  Hyperglycemia 01/30/2009   Past Medical History:  Diagnosis Date  . Anxiety state, unspecified   . Dysthymic disorder   . First degree atrioventricular block   . Hyperglycemia 01/30/09  . Malignant neoplasm of breast (female), unspecified site   . Osteoarthrosis, unspecified whether generalized or localized, unspecified site   . Senile osteoporosis   . Trigger finger (acquired)   . Unspecified constipation   . Unspecified essential hypertension   .  Unspecified hypothyroidism   . Unspecified vitamin D deficiency     Family History  Problem Relation Age of Onset  . Cerebrovascular Accident Mother   . Hypertension Mother   . Cerebrovascular Accident Father   . Heart disease Sister   . Emphysema Brother     Past Surgical History:  Procedure Laterality Date  . CATARACT EXTRACTION BILATERAL W/ ANTERIOR VITRECTOMY Bilateral 2002  . MASTECTOMY Left 1986   Dr.Weatherly   . ORIF FACIAL FRACTURE  1962   Social History   Occupational History  . Not on file  Tobacco Use  . Smoking status: Never Smoker  . Smokeless tobacco: Never Used  Substance and Sexual Activity  . Alcohol use: No    Alcohol/week: 0.0 oz  . Drug use: No  . Sexual activity: No

## 2017-06-24 ENCOUNTER — Other Ambulatory Visit: Payer: Medicare Other

## 2017-06-26 ENCOUNTER — Ambulatory Visit: Payer: Medicare Other | Admitting: Internal Medicine

## 2017-07-02 ENCOUNTER — Encounter (INDEPENDENT_AMBULATORY_CARE_PROVIDER_SITE_OTHER): Payer: Self-pay | Admitting: Orthopedic Surgery

## 2017-07-02 ENCOUNTER — Ambulatory Visit (INDEPENDENT_AMBULATORY_CARE_PROVIDER_SITE_OTHER): Payer: Medicare Other

## 2017-07-02 ENCOUNTER — Ambulatory Visit (INDEPENDENT_AMBULATORY_CARE_PROVIDER_SITE_OTHER): Payer: Medicare Other | Admitting: Orthopedic Surgery

## 2017-07-02 DIAGNOSIS — S32511D Fracture of superior rim of right pubis, subsequent encounter for fracture with routine healing: Secondary | ICD-10-CM

## 2017-07-02 DIAGNOSIS — R278 Other lack of coordination: Secondary | ICD-10-CM | POA: Diagnosis not present

## 2017-07-02 DIAGNOSIS — N183 Chronic kidney disease, stage 3 (moderate): Secondary | ICD-10-CM | POA: Diagnosis not present

## 2017-07-02 DIAGNOSIS — R2681 Unsteadiness on feet: Secondary | ICD-10-CM | POA: Diagnosis not present

## 2017-07-02 DIAGNOSIS — N390 Urinary tract infection, site not specified: Secondary | ICD-10-CM | POA: Diagnosis not present

## 2017-07-02 DIAGNOSIS — I10 Essential (primary) hypertension: Secondary | ICD-10-CM | POA: Diagnosis not present

## 2017-07-02 DIAGNOSIS — M6281 Muscle weakness (generalized): Secondary | ICD-10-CM | POA: Diagnosis not present

## 2017-07-02 DIAGNOSIS — S32591D Other specified fracture of right pubis, subsequent encounter for fracture with routine healing: Secondary | ICD-10-CM | POA: Diagnosis not present

## 2017-07-02 DIAGNOSIS — M79604 Pain in right leg: Secondary | ICD-10-CM | POA: Diagnosis not present

## 2017-07-05 NOTE — Progress Notes (Signed)
   Post-Op Visit Note   Patient: Diana Anthony           Date of Birth: 08-29-1916           MRN: [id number] Visit Date: 07/02/2017 PCP: Gayland Curry, DO   Assessment & Plan:  Chief Complaint:  Chief Complaint  Patient presents with  . Follow-up    superior ramus fx of right pubis   Visit Diagnoses:  1. Closed fracture of superior ramus of right pubis with routine healing, subsequent encounter     Plan: Follow-up of pelvic fracture.  On examination she has fairly minimal to no pain with manipulation and range of motion of the right hip.  Radiographs show no change in fracture alignment.  Plan at this time is to increase her to weightbearing as tolerated.  Follow-up in 3 weeks with repeat x-rays and likely release at that time.  Follow-Up Instructions: Return in about 3 weeks (around 07/23/2017).   Orders:  Orders Placed This Encounter  Procedures  . XR Pelvis 1-2 Views   No orders of the defined types were placed in this encounter.   Imaging: No results found.  PMFS History: Patient Active Problem List   Diagnosis Date Noted  . UTI (urinary tract infection) 05/31/2017  . Closed fracture of right superior rim of pubis ([place]) 05/30/2017  . Closed fracture of right inferior pubic ramus ([place]) 05/30/2017  . Compression fracture of L1 lumbar vertebra (HCC) 05/30/2017  . Spondylolisthesis, lumbar region 05/30/2017  . Chronic kidney disease (CKD) stage G2/A2, mildly decreased glomerular filtration rate (GFR) between 60-89 mL/min/1.73 square meter and albuminuria creatinine ratio between 30-299 mg/g 01/31/2015  . Memory impairment 01/31/2015  . Osteoarthritis of right knee 02/24/2014  . Urinary tract infection, site not specified 12/16/2012  . Unspecified vitamin D deficiency   . Anxiety state   . History of breast cancer in female   . Hypothyroidism   . Essential hypertension   . Hyperglycemia 01/30/2009   Past Medical History:  Diagnosis Date  . Anxiety state,  unspecified   . Dysthymic disorder   . First degree atrioventricular block   . Hyperglycemia 01/30/09  . Malignant neoplasm of breast (female), unspecified site   . Osteoarthrosis, unspecified whether generalized or localized, unspecified site   . Senile osteoporosis   . Trigger finger (acquired)   . Unspecified constipation   . Unspecified essential hypertension   . Unspecified hypothyroidism   . Unspecified vitamin D deficiency     Family History  Problem Relation Age of Onset  . Cerebrovascular Accident Mother   . Hypertension Mother   . Cerebrovascular Accident Father   . Heart disease Sister   . Emphysema Brother     Past Surgical History:  Procedure Laterality Date  . CATARACT EXTRACTION BILATERAL W/ ANTERIOR VITRECTOMY Bilateral 2002  . MASTECTOMY Left 1986   Dr.Weatherly   . ORIF FACIAL FRACTURE  1962   Social History   Occupational History  . Not on file  Tobacco Use  . Smoking status: Never Smoker  . Smokeless tobacco: Never Used  Substance and Sexual Activity  . Alcohol use: No    Alcohol/week: 0.0 oz  . Drug use: No  . Sexual activity: No

## 2017-07-22 DIAGNOSIS — F0390 Unspecified dementia without behavioral disturbance: Secondary | ICD-10-CM | POA: Diagnosis not present

## 2017-07-22 DIAGNOSIS — M6281 Muscle weakness (generalized): Secondary | ICD-10-CM | POA: Diagnosis not present

## 2017-07-22 DIAGNOSIS — N183 Chronic kidney disease, stage 3 (moderate): Secondary | ICD-10-CM | POA: Diagnosis not present

## 2017-07-22 DIAGNOSIS — Z9181 History of falling: Secondary | ICD-10-CM | POA: Diagnosis not present

## 2017-07-22 DIAGNOSIS — S32511D Fracture of superior rim of right pubis, subsequent encounter for fracture with routine healing: Secondary | ICD-10-CM | POA: Diagnosis not present

## 2017-07-22 DIAGNOSIS — I129 Hypertensive chronic kidney disease with stage 1 through stage 4 chronic kidney disease, or unspecified chronic kidney disease: Secondary | ICD-10-CM | POA: Diagnosis not present

## 2017-07-22 DIAGNOSIS — M25551 Pain in right hip: Secondary | ICD-10-CM | POA: Diagnosis not present

## 2017-07-24 ENCOUNTER — Encounter: Payer: Medicare Other | Admitting: Internal Medicine

## 2017-07-24 ENCOUNTER — Encounter (INDEPENDENT_AMBULATORY_CARE_PROVIDER_SITE_OTHER): Payer: Self-pay | Admitting: Orthopedic Surgery

## 2017-07-24 ENCOUNTER — Ambulatory Visit (INDEPENDENT_AMBULATORY_CARE_PROVIDER_SITE_OTHER): Payer: Medicare Other

## 2017-07-24 ENCOUNTER — Ambulatory Visit (INDEPENDENT_AMBULATORY_CARE_PROVIDER_SITE_OTHER): Payer: Medicare Other | Admitting: Orthopedic Surgery

## 2017-07-24 DIAGNOSIS — S32511D Fracture of superior rim of right pubis, subsequent encounter for fracture with routine healing: Secondary | ICD-10-CM | POA: Diagnosis not present

## 2017-07-24 DIAGNOSIS — Z09 Encounter for follow-up examination after completed treatment for conditions other than malignant neoplasm: Secondary | ICD-10-CM | POA: Diagnosis not present

## 2017-07-24 DIAGNOSIS — F0390 Unspecified dementia without behavioral disturbance: Secondary | ICD-10-CM | POA: Diagnosis not present

## 2017-07-24 DIAGNOSIS — M6281 Muscle weakness (generalized): Secondary | ICD-10-CM | POA: Diagnosis not present

## 2017-07-24 DIAGNOSIS — I129 Hypertensive chronic kidney disease with stage 1 through stage 4 chronic kidney disease, or unspecified chronic kidney disease: Secondary | ICD-10-CM | POA: Diagnosis not present

## 2017-07-24 DIAGNOSIS — M25551 Pain in right hip: Secondary | ICD-10-CM | POA: Diagnosis not present

## 2017-07-24 DIAGNOSIS — N183 Chronic kidney disease, stage 3 (moderate): Secondary | ICD-10-CM | POA: Diagnosis not present

## 2017-07-24 NOTE — Progress Notes (Signed)
   Post-Op Visit Note   Patient: Diana Anthony           Date of Birth: August 21, 1916           MRN: [id number] Visit Date: 07/24/2017 PCP: Gayland Curry, DO   Assessment & Plan:  Chief Complaint:  Chief Complaint  Patient presents with  . Follow-up    ramus fracture   Visit Diagnoses:  1. Fracture follow-up     Plan: Patient presents for follow-up of superior and inferior rami fracture.  She is been doing well.  She is been weightbearing as tolerated in a rolling walker.  She is now in assisted living.  On examination she has diminished pain with hip range of motion.  Radiographs show callus formation.  Plan is continue weightbearing as tolerated.  Follow-up with me as needed.  Follow-Up Instructions: Return if symptoms worsen or fail to improve.   Orders:  Orders Placed This Encounter  Procedures  . XR Pelvis 1-2 Views   No orders of the defined types were placed in this encounter.   Imaging: Xr Pelvis 1-2 Views  Result Date: 07/24/2017 AP pelvis reviewed.  Right superior and inferior pubic rami fractures have callus formation present.  No significant displacement.  Arthritis is present in the hip joints bilaterally.   PMFS History: Patient Active Problem List   Diagnosis Date Noted  . UTI (urinary tract infection) 05/31/2017  . Closed fracture of right superior rim of pubis ([place]) 05/30/2017  . Closed fracture of right inferior pubic ramus ([place]) 05/30/2017  . Compression fracture of L1 lumbar vertebra (HCC) 05/30/2017  . Spondylolisthesis, lumbar region 05/30/2017  . Chronic kidney disease (CKD) stage G2/A2, mildly decreased glomerular filtration rate (GFR) between 60-89 mL/min/1.73 square meter and albuminuria creatinine ratio between 30-299 mg/g 01/31/2015  . Memory impairment 01/31/2015  . Osteoarthritis of right knee 02/24/2014  . Urinary tract infection, site not specified 12/16/2012  . Unspecified vitamin D deficiency   . Anxiety state   . History of breast  cancer in female   . Hypothyroidism   . Essential hypertension   . Hyperglycemia 01/30/2009   Past Medical History:  Diagnosis Date  . Anxiety state, unspecified   . Dysthymic disorder   . First degree atrioventricular block   . Hyperglycemia 01/30/09  . Malignant neoplasm of breast (female), unspecified site   . Osteoarthrosis, unspecified whether generalized or localized, unspecified site   . Senile osteoporosis   . Trigger finger (acquired)   . Unspecified constipation   . Unspecified essential hypertension   . Unspecified hypothyroidism   . Unspecified vitamin D deficiency     Family History  Problem Relation Age of Onset  . Cerebrovascular Accident Mother   . Hypertension Mother   . Cerebrovascular Accident Father   . Heart disease Sister   . Emphysema Brother     Past Surgical History:  Procedure Laterality Date  . CATARACT EXTRACTION BILATERAL W/ ANTERIOR VITRECTOMY Bilateral 2002  . MASTECTOMY Left 1986   Dr.Weatherly   . ORIF FACIAL FRACTURE  1962   Social History   Occupational History  . Not on file  Tobacco Use  . Smoking status: Never Smoker  . Smokeless tobacco: Never Used  Substance and Sexual Activity  . Alcohol use: No    Alcohol/week: 0.0 oz  . Drug use: No  . Sexual activity: No

## 2017-07-25 DIAGNOSIS — F0390 Unspecified dementia without behavioral disturbance: Secondary | ICD-10-CM | POA: Diagnosis not present

## 2017-07-25 DIAGNOSIS — S32511D Fracture of superior rim of right pubis, subsequent encounter for fracture with routine healing: Secondary | ICD-10-CM | POA: Diagnosis not present

## 2017-07-25 DIAGNOSIS — I129 Hypertensive chronic kidney disease with stage 1 through stage 4 chronic kidney disease, or unspecified chronic kidney disease: Secondary | ICD-10-CM | POA: Diagnosis not present

## 2017-07-25 DIAGNOSIS — M25551 Pain in right hip: Secondary | ICD-10-CM | POA: Diagnosis not present

## 2017-07-25 DIAGNOSIS — N183 Chronic kidney disease, stage 3 (moderate): Secondary | ICD-10-CM | POA: Diagnosis not present

## 2017-07-25 DIAGNOSIS — M6281 Muscle weakness (generalized): Secondary | ICD-10-CM | POA: Diagnosis not present

## 2017-07-28 DIAGNOSIS — M6281 Muscle weakness (generalized): Secondary | ICD-10-CM | POA: Diagnosis not present

## 2017-07-28 DIAGNOSIS — M25551 Pain in right hip: Secondary | ICD-10-CM | POA: Diagnosis not present

## 2017-07-28 DIAGNOSIS — F0390 Unspecified dementia without behavioral disturbance: Secondary | ICD-10-CM | POA: Diagnosis not present

## 2017-07-28 DIAGNOSIS — M79673 Pain in unspecified foot: Secondary | ICD-10-CM | POA: Diagnosis not present

## 2017-07-28 DIAGNOSIS — N183 Chronic kidney disease, stage 3 (moderate): Secondary | ICD-10-CM | POA: Diagnosis not present

## 2017-07-28 DIAGNOSIS — I129 Hypertensive chronic kidney disease with stage 1 through stage 4 chronic kidney disease, or unspecified chronic kidney disease: Secondary | ICD-10-CM | POA: Diagnosis not present

## 2017-07-28 DIAGNOSIS — L603 Nail dystrophy: Secondary | ICD-10-CM | POA: Diagnosis not present

## 2017-07-28 DIAGNOSIS — S32511D Fracture of superior rim of right pubis, subsequent encounter for fracture with routine healing: Secondary | ICD-10-CM | POA: Diagnosis not present

## 2017-07-28 DIAGNOSIS — R2689 Other abnormalities of gait and mobility: Secondary | ICD-10-CM | POA: Diagnosis not present

## 2017-07-30 DIAGNOSIS — N183 Chronic kidney disease, stage 3 (moderate): Secondary | ICD-10-CM | POA: Diagnosis not present

## 2017-07-31 DIAGNOSIS — F0390 Unspecified dementia without behavioral disturbance: Secondary | ICD-10-CM | POA: Diagnosis not present

## 2017-07-31 DIAGNOSIS — E039 Hypothyroidism, unspecified: Secondary | ICD-10-CM | POA: Diagnosis not present

## 2017-07-31 DIAGNOSIS — M25551 Pain in right hip: Secondary | ICD-10-CM | POA: Diagnosis not present

## 2017-07-31 DIAGNOSIS — S32511D Fracture of superior rim of right pubis, subsequent encounter for fracture with routine healing: Secondary | ICD-10-CM | POA: Diagnosis not present

## 2017-07-31 DIAGNOSIS — M6281 Muscle weakness (generalized): Secondary | ICD-10-CM | POA: Diagnosis not present

## 2017-07-31 DIAGNOSIS — I129 Hypertensive chronic kidney disease with stage 1 through stage 4 chronic kidney disease, or unspecified chronic kidney disease: Secondary | ICD-10-CM | POA: Diagnosis not present

## 2017-07-31 DIAGNOSIS — R0602 Shortness of breath: Secondary | ICD-10-CM | POA: Diagnosis not present

## 2017-07-31 DIAGNOSIS — N183 Chronic kidney disease, stage 3 (moderate): Secondary | ICD-10-CM | POA: Diagnosis not present

## 2017-07-31 DIAGNOSIS — Z79899 Other long term (current) drug therapy: Secondary | ICD-10-CM | POA: Diagnosis not present

## 2017-08-01 DIAGNOSIS — I129 Hypertensive chronic kidney disease with stage 1 through stage 4 chronic kidney disease, or unspecified chronic kidney disease: Secondary | ICD-10-CM | POA: Diagnosis not present

## 2017-08-01 DIAGNOSIS — M25551 Pain in right hip: Secondary | ICD-10-CM | POA: Diagnosis not present

## 2017-08-01 DIAGNOSIS — M6281 Muscle weakness (generalized): Secondary | ICD-10-CM | POA: Diagnosis not present

## 2017-08-01 DIAGNOSIS — N183 Chronic kidney disease, stage 3 (moderate): Secondary | ICD-10-CM | POA: Diagnosis not present

## 2017-08-01 DIAGNOSIS — F0390 Unspecified dementia without behavioral disturbance: Secondary | ICD-10-CM | POA: Diagnosis not present

## 2017-08-01 DIAGNOSIS — S32511D Fracture of superior rim of right pubis, subsequent encounter for fracture with routine healing: Secondary | ICD-10-CM | POA: Diagnosis not present

## 2017-08-04 DIAGNOSIS — N183 Chronic kidney disease, stage 3 (moderate): Secondary | ICD-10-CM | POA: Diagnosis not present

## 2017-08-04 DIAGNOSIS — M6281 Muscle weakness (generalized): Secondary | ICD-10-CM | POA: Diagnosis not present

## 2017-08-04 DIAGNOSIS — I129 Hypertensive chronic kidney disease with stage 1 through stage 4 chronic kidney disease, or unspecified chronic kidney disease: Secondary | ICD-10-CM | POA: Diagnosis not present

## 2017-08-04 DIAGNOSIS — M25551 Pain in right hip: Secondary | ICD-10-CM | POA: Diagnosis not present

## 2017-08-04 DIAGNOSIS — S32511D Fracture of superior rim of right pubis, subsequent encounter for fracture with routine healing: Secondary | ICD-10-CM | POA: Diagnosis not present

## 2017-08-04 DIAGNOSIS — F0390 Unspecified dementia without behavioral disturbance: Secondary | ICD-10-CM | POA: Diagnosis not present

## 2017-08-06 DIAGNOSIS — F0390 Unspecified dementia without behavioral disturbance: Secondary | ICD-10-CM | POA: Diagnosis not present

## 2017-08-06 DIAGNOSIS — M6281 Muscle weakness (generalized): Secondary | ICD-10-CM | POA: Diagnosis not present

## 2017-08-06 DIAGNOSIS — I5040 Unspecified combined systolic (congestive) and diastolic (congestive) heart failure: Secondary | ICD-10-CM | POA: Diagnosis not present

## 2017-08-06 DIAGNOSIS — I129 Hypertensive chronic kidney disease with stage 1 through stage 4 chronic kidney disease, or unspecified chronic kidney disease: Secondary | ICD-10-CM | POA: Diagnosis not present

## 2017-08-06 DIAGNOSIS — M25551 Pain in right hip: Secondary | ICD-10-CM | POA: Diagnosis not present

## 2017-08-06 DIAGNOSIS — N183 Chronic kidney disease, stage 3 (moderate): Secondary | ICD-10-CM | POA: Diagnosis not present

## 2017-08-06 DIAGNOSIS — S32511D Fracture of superior rim of right pubis, subsequent encounter for fracture with routine healing: Secondary | ICD-10-CM | POA: Diagnosis not present

## 2017-08-07 DIAGNOSIS — I509 Heart failure, unspecified: Secondary | ICD-10-CM | POA: Diagnosis not present

## 2017-08-08 DIAGNOSIS — M6281 Muscle weakness (generalized): Secondary | ICD-10-CM | POA: Diagnosis not present

## 2017-08-08 DIAGNOSIS — S32511D Fracture of superior rim of right pubis, subsequent encounter for fracture with routine healing: Secondary | ICD-10-CM | POA: Diagnosis not present

## 2017-08-08 DIAGNOSIS — N183 Chronic kidney disease, stage 3 (moderate): Secondary | ICD-10-CM | POA: Diagnosis not present

## 2017-08-08 DIAGNOSIS — F0390 Unspecified dementia without behavioral disturbance: Secondary | ICD-10-CM | POA: Diagnosis not present

## 2017-08-08 DIAGNOSIS — I129 Hypertensive chronic kidney disease with stage 1 through stage 4 chronic kidney disease, or unspecified chronic kidney disease: Secondary | ICD-10-CM | POA: Diagnosis not present

## 2017-08-08 DIAGNOSIS — M25551 Pain in right hip: Secondary | ICD-10-CM | POA: Diagnosis not present

## 2017-08-11 DIAGNOSIS — D649 Anemia, unspecified: Secondary | ICD-10-CM | POA: Diagnosis not present

## 2017-08-11 DIAGNOSIS — I10 Essential (primary) hypertension: Secondary | ICD-10-CM | POA: Diagnosis not present

## 2017-08-11 DIAGNOSIS — R0602 Shortness of breath: Secondary | ICD-10-CM | POA: Diagnosis not present

## 2017-08-12 DIAGNOSIS — S32511D Fracture of superior rim of right pubis, subsequent encounter for fracture with routine healing: Secondary | ICD-10-CM | POA: Diagnosis not present

## 2017-08-12 DIAGNOSIS — N183 Chronic kidney disease, stage 3 (moderate): Secondary | ICD-10-CM | POA: Diagnosis not present

## 2017-08-12 DIAGNOSIS — F0390 Unspecified dementia without behavioral disturbance: Secondary | ICD-10-CM | POA: Diagnosis not present

## 2017-08-12 DIAGNOSIS — M6281 Muscle weakness (generalized): Secondary | ICD-10-CM | POA: Diagnosis not present

## 2017-08-12 DIAGNOSIS — I129 Hypertensive chronic kidney disease with stage 1 through stage 4 chronic kidney disease, or unspecified chronic kidney disease: Secondary | ICD-10-CM | POA: Diagnosis not present

## 2017-08-12 DIAGNOSIS — M25551 Pain in right hip: Secondary | ICD-10-CM | POA: Diagnosis not present

## 2017-08-13 DIAGNOSIS — F0390 Unspecified dementia without behavioral disturbance: Secondary | ICD-10-CM | POA: Diagnosis not present

## 2017-08-13 DIAGNOSIS — M6281 Muscle weakness (generalized): Secondary | ICD-10-CM | POA: Diagnosis not present

## 2017-08-13 DIAGNOSIS — I129 Hypertensive chronic kidney disease with stage 1 through stage 4 chronic kidney disease, or unspecified chronic kidney disease: Secondary | ICD-10-CM | POA: Diagnosis not present

## 2017-08-13 DIAGNOSIS — S32511D Fracture of superior rim of right pubis, subsequent encounter for fracture with routine healing: Secondary | ICD-10-CM | POA: Diagnosis not present

## 2017-08-13 DIAGNOSIS — N183 Chronic kidney disease, stage 3 (moderate): Secondary | ICD-10-CM | POA: Diagnosis not present

## 2017-08-13 DIAGNOSIS — M25551 Pain in right hip: Secondary | ICD-10-CM | POA: Diagnosis not present

## 2017-08-14 DIAGNOSIS — Z79899 Other long term (current) drug therapy: Secondary | ICD-10-CM | POA: Diagnosis not present

## 2017-08-14 DIAGNOSIS — N390 Urinary tract infection, site not specified: Secondary | ICD-10-CM | POA: Diagnosis not present

## 2017-08-14 DIAGNOSIS — S32511D Fracture of superior rim of right pubis, subsequent encounter for fracture with routine healing: Secondary | ICD-10-CM | POA: Diagnosis not present

## 2017-08-14 DIAGNOSIS — M25551 Pain in right hip: Secondary | ICD-10-CM | POA: Diagnosis not present

## 2017-08-14 DIAGNOSIS — F0390 Unspecified dementia without behavioral disturbance: Secondary | ICD-10-CM | POA: Diagnosis not present

## 2017-08-14 DIAGNOSIS — I129 Hypertensive chronic kidney disease with stage 1 through stage 4 chronic kidney disease, or unspecified chronic kidney disease: Secondary | ICD-10-CM | POA: Diagnosis not present

## 2017-08-14 DIAGNOSIS — R319 Hematuria, unspecified: Secondary | ICD-10-CM | POA: Diagnosis not present

## 2017-08-14 DIAGNOSIS — M6281 Muscle weakness (generalized): Secondary | ICD-10-CM | POA: Diagnosis not present

## 2017-08-14 DIAGNOSIS — N183 Chronic kidney disease, stage 3 (moderate): Secondary | ICD-10-CM | POA: Diagnosis not present

## 2017-08-15 DIAGNOSIS — I129 Hypertensive chronic kidney disease with stage 1 through stage 4 chronic kidney disease, or unspecified chronic kidney disease: Secondary | ICD-10-CM | POA: Diagnosis not present

## 2017-08-15 DIAGNOSIS — F0390 Unspecified dementia without behavioral disturbance: Secondary | ICD-10-CM | POA: Diagnosis not present

## 2017-08-15 DIAGNOSIS — M6281 Muscle weakness (generalized): Secondary | ICD-10-CM | POA: Diagnosis not present

## 2017-08-15 DIAGNOSIS — N183 Chronic kidney disease, stage 3 (moderate): Secondary | ICD-10-CM | POA: Diagnosis not present

## 2017-08-15 DIAGNOSIS — S32511D Fracture of superior rim of right pubis, subsequent encounter for fracture with routine healing: Secondary | ICD-10-CM | POA: Diagnosis not present

## 2017-08-15 DIAGNOSIS — M25551 Pain in right hip: Secondary | ICD-10-CM | POA: Diagnosis not present

## 2017-08-19 DIAGNOSIS — M25551 Pain in right hip: Secondary | ICD-10-CM | POA: Diagnosis not present

## 2017-08-19 DIAGNOSIS — S32511D Fracture of superior rim of right pubis, subsequent encounter for fracture with routine healing: Secondary | ICD-10-CM | POA: Diagnosis not present

## 2017-08-19 DIAGNOSIS — N183 Chronic kidney disease, stage 3 (moderate): Secondary | ICD-10-CM | POA: Diagnosis not present

## 2017-08-19 DIAGNOSIS — F0390 Unspecified dementia without behavioral disturbance: Secondary | ICD-10-CM | POA: Diagnosis not present

## 2017-08-19 DIAGNOSIS — I129 Hypertensive chronic kidney disease with stage 1 through stage 4 chronic kidney disease, or unspecified chronic kidney disease: Secondary | ICD-10-CM | POA: Diagnosis not present

## 2017-08-19 DIAGNOSIS — M6281 Muscle weakness (generalized): Secondary | ICD-10-CM | POA: Diagnosis not present

## 2017-08-20 DIAGNOSIS — I129 Hypertensive chronic kidney disease with stage 1 through stage 4 chronic kidney disease, or unspecified chronic kidney disease: Secondary | ICD-10-CM | POA: Diagnosis not present

## 2017-08-20 DIAGNOSIS — M25551 Pain in right hip: Secondary | ICD-10-CM | POA: Diagnosis not present

## 2017-08-20 DIAGNOSIS — M6281 Muscle weakness (generalized): Secondary | ICD-10-CM | POA: Diagnosis not present

## 2017-08-20 DIAGNOSIS — F0390 Unspecified dementia without behavioral disturbance: Secondary | ICD-10-CM | POA: Diagnosis not present

## 2017-08-20 DIAGNOSIS — S32511D Fracture of superior rim of right pubis, subsequent encounter for fracture with routine healing: Secondary | ICD-10-CM | POA: Diagnosis not present

## 2017-08-20 DIAGNOSIS — N183 Chronic kidney disease, stage 3 (moderate): Secondary | ICD-10-CM | POA: Diagnosis not present

## 2017-08-22 DIAGNOSIS — S32511D Fracture of superior rim of right pubis, subsequent encounter for fracture with routine healing: Secondary | ICD-10-CM | POA: Diagnosis not present

## 2017-08-22 DIAGNOSIS — M6281 Muscle weakness (generalized): Secondary | ICD-10-CM | POA: Diagnosis not present

## 2017-08-22 DIAGNOSIS — N183 Chronic kidney disease, stage 3 (moderate): Secondary | ICD-10-CM | POA: Diagnosis not present

## 2017-08-22 DIAGNOSIS — I129 Hypertensive chronic kidney disease with stage 1 through stage 4 chronic kidney disease, or unspecified chronic kidney disease: Secondary | ICD-10-CM | POA: Diagnosis not present

## 2017-08-22 DIAGNOSIS — M25551 Pain in right hip: Secondary | ICD-10-CM | POA: Diagnosis not present

## 2017-08-22 DIAGNOSIS — F0390 Unspecified dementia without behavioral disturbance: Secondary | ICD-10-CM | POA: Diagnosis not present

## 2017-08-25 DIAGNOSIS — M25551 Pain in right hip: Secondary | ICD-10-CM | POA: Diagnosis not present

## 2017-08-25 DIAGNOSIS — S32511D Fracture of superior rim of right pubis, subsequent encounter for fracture with routine healing: Secondary | ICD-10-CM | POA: Diagnosis not present

## 2017-08-25 DIAGNOSIS — M6281 Muscle weakness (generalized): Secondary | ICD-10-CM | POA: Diagnosis not present

## 2017-08-25 DIAGNOSIS — F0390 Unspecified dementia without behavioral disturbance: Secondary | ICD-10-CM | POA: Diagnosis not present

## 2017-08-25 DIAGNOSIS — I129 Hypertensive chronic kidney disease with stage 1 through stage 4 chronic kidney disease, or unspecified chronic kidney disease: Secondary | ICD-10-CM | POA: Diagnosis not present

## 2017-08-25 DIAGNOSIS — N183 Chronic kidney disease, stage 3 (moderate): Secondary | ICD-10-CM | POA: Diagnosis not present

## 2017-09-01 DIAGNOSIS — I129 Hypertensive chronic kidney disease with stage 1 through stage 4 chronic kidney disease, or unspecified chronic kidney disease: Secondary | ICD-10-CM | POA: Diagnosis not present

## 2017-09-01 DIAGNOSIS — N183 Chronic kidney disease, stage 3 (moderate): Secondary | ICD-10-CM | POA: Diagnosis not present

## 2017-09-01 DIAGNOSIS — M6281 Muscle weakness (generalized): Secondary | ICD-10-CM | POA: Diagnosis not present

## 2017-09-01 DIAGNOSIS — F0390 Unspecified dementia without behavioral disturbance: Secondary | ICD-10-CM | POA: Diagnosis not present

## 2017-09-01 DIAGNOSIS — S32511D Fracture of superior rim of right pubis, subsequent encounter for fracture with routine healing: Secondary | ICD-10-CM | POA: Diagnosis not present

## 2017-09-01 DIAGNOSIS — M25551 Pain in right hip: Secondary | ICD-10-CM | POA: Diagnosis not present

## 2017-09-24 DIAGNOSIS — R05 Cough: Secondary | ICD-10-CM | POA: Diagnosis not present

## 2017-09-26 ENCOUNTER — Emergency Department (HOSPITAL_COMMUNITY): Payer: Medicare Other

## 2017-09-26 ENCOUNTER — Encounter (HOSPITAL_COMMUNITY): Payer: Self-pay | Admitting: *Deleted

## 2017-09-26 ENCOUNTER — Emergency Department (HOSPITAL_COMMUNITY)
Admission: EM | Admit: 2017-09-26 | Discharge: 2017-09-26 | Disposition: A | Discharge status: Home / Self Care | Payer: Medicare Other | Attending: Emergency Medicine | Admitting: Emergency Medicine

## 2017-09-26 DIAGNOSIS — I129 Hypertensive chronic kidney disease with stage 1 through stage 4 chronic kidney disease, or unspecified chronic kidney disease: Secondary | ICD-10-CM | POA: Insufficient documentation

## 2017-09-26 DIAGNOSIS — Y9389 Activity, other specified: Secondary | ICD-10-CM | POA: Diagnosis not present

## 2017-09-26 DIAGNOSIS — W19XXXA Unspecified fall, initial encounter: Secondary | ICD-10-CM | POA: Diagnosis not present

## 2017-09-26 DIAGNOSIS — S0990XA Unspecified injury of head, initial encounter: Secondary | ICD-10-CM | POA: Diagnosis not present

## 2017-09-26 DIAGNOSIS — Y92129 Unspecified place in nursing home as the place of occurrence of the external cause: Secondary | ICD-10-CM | POA: Insufficient documentation

## 2017-09-26 DIAGNOSIS — E039 Hypothyroidism, unspecified: Secondary | ICD-10-CM | POA: Diagnosis not present

## 2017-09-26 DIAGNOSIS — T148XXA Other injury of unspecified body region, initial encounter: Secondary | ICD-10-CM | POA: Diagnosis not present

## 2017-09-26 DIAGNOSIS — M25561 Pain in right knee: Secondary | ICD-10-CM | POA: Insufficient documentation

## 2017-09-26 DIAGNOSIS — M25522 Pain in left elbow: Secondary | ICD-10-CM | POA: Insufficient documentation

## 2017-09-26 DIAGNOSIS — Z79899 Other long term (current) drug therapy: Secondary | ICD-10-CM | POA: Insufficient documentation

## 2017-09-26 DIAGNOSIS — N182 Chronic kidney disease, stage 2 (mild): Secondary | ICD-10-CM | POA: Diagnosis not present

## 2017-09-26 DIAGNOSIS — Y998 Other external cause status: Secondary | ICD-10-CM | POA: Diagnosis not present

## 2017-09-26 DIAGNOSIS — R079 Chest pain, unspecified: Secondary | ICD-10-CM | POA: Diagnosis not present

## 2017-09-26 DIAGNOSIS — S59902A Unspecified injury of left elbow, initial encounter: Secondary | ICD-10-CM | POA: Diagnosis not present

## 2017-09-26 DIAGNOSIS — M25562 Pain in left knee: Secondary | ICD-10-CM | POA: Insufficient documentation

## 2017-09-26 DIAGNOSIS — R52 Pain, unspecified: Secondary | ICD-10-CM

## 2017-09-26 DIAGNOSIS — S199XXA Unspecified injury of neck, initial encounter: Secondary | ICD-10-CM | POA: Diagnosis not present

## 2017-09-26 DIAGNOSIS — T07XXXA Unspecified multiple injuries, initial encounter: Secondary | ICD-10-CM

## 2017-09-26 DIAGNOSIS — S8991XA Unspecified injury of right lower leg, initial encounter: Secondary | ICD-10-CM | POA: Diagnosis not present

## 2017-09-26 DIAGNOSIS — Z7982 Long term (current) use of aspirin: Secondary | ICD-10-CM | POA: Diagnosis not present

## 2017-09-26 DIAGNOSIS — S8992XA Unspecified injury of left lower leg, initial encounter: Secondary | ICD-10-CM | POA: Diagnosis not present

## 2017-09-26 DIAGNOSIS — I671 Cerebral aneurysm, nonruptured: Secondary | ICD-10-CM | POA: Insufficient documentation

## 2017-09-26 DIAGNOSIS — S32511A Fracture of superior rim of right pubis, initial encounter for closed fracture: Secondary | ICD-10-CM | POA: Diagnosis not present

## 2017-09-26 DIAGNOSIS — M25521 Pain in right elbow: Secondary | ICD-10-CM | POA: Diagnosis not present

## 2017-09-26 LAB — CBC WITH DIFFERENTIAL/PLATELET
BASOS PCT: 1 %
Basophils Absolute: 0.1 10*3/uL (ref 0.0–0.1)
EOS ABS: 0.2 10*3/uL (ref 0.0–0.7)
Eosinophils Relative: 3 %
HEMATOCRIT: 32.6 % — AB (ref 36.0–46.0)
Hemoglobin: 10.3 g/dL — ABNORMAL LOW (ref 12.0–15.0)
Lymphocytes Relative: 19 %
Lymphs Abs: 1.3 10*3/uL (ref 0.7–4.0)
MCH: 29.8 pg (ref 26.0–34.0)
MCHC: 31.6 g/dL (ref 30.0–36.0)
MCV: 94.2 fL (ref 78.0–100.0)
MONO ABS: 0.8 10*3/uL (ref 0.1–1.0)
MONOS PCT: 13 %
NEUTROS ABS: 4.3 10*3/uL (ref 1.7–7.7)
Neutrophils Relative %: 64 %
Platelets: 259 10*3/uL (ref 150–400)
RBC: 3.46 MIL/uL — ABNORMAL LOW (ref 3.87–5.11)
RDW: 14.4 % (ref 11.5–15.5)
WBC: 6.7 10*3/uL (ref 4.0–10.5)

## 2017-09-26 LAB — BASIC METABOLIC PANEL
Anion gap: 12 (ref 5–15)
BUN: 38 mg/dL — AB (ref 6–20)
CO2: 23 mmol/L (ref 22–32)
CREATININE: 1.64 mg/dL — AB (ref 0.44–1.00)
Calcium: 9.1 mg/dL (ref 8.9–10.3)
Chloride: 103 mmol/L (ref 101–111)
GFR calc Af Amer: 29 mL/min — ABNORMAL LOW (ref >60)
GFR calc non Af Amer: 25 mL/min — ABNORMAL LOW (ref >60)
GLUCOSE: 98 mg/dL (ref 65–99)
Potassium: 3.8 mmol/L (ref 3.5–5.1)
Sodium: 138 mmol/L (ref 135–145)

## 2017-09-26 MED ORDER — ACETAMINOPHEN 325 MG PO TABS
650.0000 mg | ORAL_TABLET | Freq: Once | ORAL | Status: AC
  Administered 2017-09-26: 650 mg via ORAL
  Filled 2017-09-26: qty 2

## 2017-09-26 NOTE — ED Triage Notes (Signed)
Per EMS, pt from [facility] here for fall. Pt states she lost her balance and fell. Pt complains of left elbow and left buttock pain, left knee pain. Pt did not lose consciousness or hit head, pt is not on blood thinners.   BP 156/84 HR 77 RR 18 CBG 112

## 2017-09-26 NOTE — Discharge Instructions (Addendum)
You were seen in the emergency department for a fall.  You had multiple x-rays and CAT scans and it was found that you have an aneurysm in your brain that is not ruptured.  There is no intervention to be done for this.  You should take Tylenol for pain and follow-up with your doctor.  please return if any worsening symptoms.

## 2017-09-26 NOTE — ED Notes (Signed)
Bed: [facility] Expected date:  Expected time:  Means of arrival:  Comments: 

## 2017-09-26 NOTE — ED Notes (Signed)
Patient transported to MRI 

## 2017-09-26 NOTE — ED Provider Notes (Signed)
[facility] DEPT Provider Note   CSN: [id number] Arrival date & time: 09/26/17  0806     History   Chief Complaint Chief Complaint  Patient presents with  . Fall    HPI Diana Anthony is a 84 y.o. female.  She is brought in by EMS after a fall at her facility.  Patient states she was reaching up for something and lost her balance and fell back.  Denies striking her head or LOC.  She has no neck pain or back pain.  She is complaining of some soreness on her left elbow and bilateral knees.  She is not on any blood thinners.  The history is provided by the patient.  Fall  This is a new problem. The current episode started 1 to 2 hours ago. The problem has been gradually improving. Pertinent negatives include no chest pain, no abdominal pain, no headaches and no shortness of breath. The symptoms are aggravated by bending and twisting. The symptoms are relieved by rest. She has tried nothing for the symptoms. The treatment provided mild relief.    Past Medical History:  Diagnosis Date  . Anxiety state, unspecified   . Dysthymic disorder   . First degree atrioventricular block   . Hyperglycemia 01/30/09  . Malignant neoplasm of breast (female), unspecified site   . Osteoarthrosis, unspecified whether generalized or localized, unspecified site   . Senile osteoporosis   . Trigger finger (acquired)   . Unspecified constipation   . Unspecified essential hypertension   . Unspecified hypothyroidism   . Unspecified vitamin D deficiency     Patient Active Problem List   Diagnosis Date Noted  . UTI (urinary tract infection) 05/31/2017  . Closed fracture of right superior rim of pubis ([place]) 05/30/2017  . Closed fracture of right inferior pubic ramus ([place]) 05/30/2017  . Compression fracture of L1 lumbar vertebra (HCC) 05/30/2017  . Spondylolisthesis, lumbar region 05/30/2017  . Chronic kidney disease (CKD) stage G2/A2, mildly decreased glomerular filtration  rate (GFR) between 60-89 mL/min/1.73 square meter and albuminuria creatinine ratio between 30-299 mg/g 01/31/2015  . Memory impairment 01/31/2015  . Osteoarthritis of right knee 02/24/2014  . Urinary tract infection, site not specified 12/16/2012  . Unspecified vitamin D deficiency   . Anxiety state   . History of breast cancer in female   . Hypothyroidism   . Essential hypertension   . Hyperglycemia 01/30/2009    Past Surgical History:  Procedure Laterality Date  . CATARACT EXTRACTION BILATERAL W/ ANTERIOR VITRECTOMY Bilateral 2002  . MASTECTOMY Left 1986   Dr.Weatherly   . ORIF FACIAL FRACTURE  1962     OB History   None      Home Medications    Prior to Admission medications   Medication Sig Start Date End Date Taking? Authorizing Provider  acetaminophen (TYLENOL) 500 MG tablet Take 2 tablets (1,000 mg total) by mouth every 6 (six) hours as needed. Patient taking differently: Take 1,000 mg by mouth every 6 (six) hours as needed for mild pain.  05/27/17   Charlesetta Shanks, MD  amLODipine-benazepril (LOTREL) 10-20 MG capsule TAKE 1 CAPSULE BY MOUTH DAILY 04/23/17   Reed, Tiffany L, DO  aspirin EC 81 MG tablet Take 1 tablet (81 mg total) by mouth daily. 10/28/16   Lauree Chandler, NP  hydrALAZINE (APRESOLINE) 25 MG tablet TAKE 1 TABLET(25 MG) BY MOUTH DAILY 06/10/17   Reed, Tiffany L, DO  levothyroxine (SYNTHROID, LEVOTHROID) 100 MCG tablet Take 1 tablet (  100 mcg total) by mouth daily. 02/20/17   Reed, Tiffany L, DO  metoprolol succinate (TOPROL-XL) 50 MG 24 hr tablet TAKE 1 TABLET BY MOUTH DAILY TO CONTROL BLOOD PRESSURE AND HEART RHYTHM.(TAKE WITH OR IMMEDIATELY FOLLOWING A MEAL) Patient taking differently: TAKE 1 TABLET (50mg ) BY MOUTH DAILY TO CONTROL BLOOD PRESSURE AND HEART RHYTHM.(TAKE WITH OR IMMEDIATELY FOLLOWING A MEAL) 04/09/17   Blanchie Serve, MD  traMADol (ULTRAM) 50 MG tablet 2 tablets every 6 hours as needed for pain.  You may take with Tylenol. 06/03/17   Doreatha Lew, MD    Family History Family History  Problem Relation Age of Onset  . Cerebrovascular Accident Mother   . Hypertension Mother   . Cerebrovascular Accident Father   . Heart disease Sister   . Emphysema Brother     Social History Social History   Tobacco Use  . Smoking status: Never Smoker  . Smokeless tobacco: Never Used  Substance Use Topics  . Alcohol use: No    Alcohol/week: 0.0 oz  . Drug use: No     Allergies   Macrobid [nitrofurantoin macrocrystal]   Review of Systems Review of Systems  Constitutional: Negative for fever.  HENT: Negative for sore throat.   Eyes: Negative for visual disturbance.  Respiratory: Negative for cough and shortness of breath.   Cardiovascular: Negative for chest pain.  Gastrointestinal: Negative for abdominal pain, nausea and vomiting.  Genitourinary: Negative for dysuria.  Musculoskeletal: Negative for back pain and neck pain.  Skin: Negative for rash.  Neurological: Negative for numbness and headaches.     Physical Exam Updated Vital Signs BP (!) 169/81 (BP Location: Left Arm)   Pulse 77   Temp 97.8 F (36.6 C) (Oral)   Resp 18   SpO2 94%   Physical Exam  Constitutional: She appears well-developed and well-nourished. No distress.  HENT:  Head: Normocephalic and atraumatic.  Eyes: Conjunctivae are normal.  Neck: Neck supple.  Cardiovascular: Normal rate and regular rhythm.  No murmur heard. Pulmonary/Chest: Effort normal and breath sounds normal. No respiratory distress.  Abdominal: Soft. There is no tenderness.  Musculoskeletal: She exhibits no edema.  She has full range of motion of all her extremities with no obvious deformity.  She has some mild tenderness over the olecranon of her left elbow and some tenderness bilateral patella.  There is no obvious open wounds.  Her cervical thoracic and lumbar spine are not tender to palpation.  Chest wall and pelvis are stable to compression.   Neurological: She  is alert. She has normal strength. No cranial nerve deficit or sensory deficit. GCS eye subscore is 4. GCS verbal subscore is 5. GCS motor subscore is 6.  Skin: Skin is warm and dry.  Psychiatric: She has a normal mood and affect.  Nursing note and vitals reviewed.    ED Treatments / Results  Labs (all labs ordered are listed, but only abnormal results are displayed) Labs Reviewed  BASIC METABOLIC PANEL - Abnormal; Notable for the following components:      Result Value   BUN 38 (*)    Creatinine, Ser 1.64 (*)    GFR calc non Af Amer 25 (*)    GFR calc Af Amer 29 (*)    All other components within normal limits  CBC WITH DIFFERENTIAL/PLATELET - Abnormal; Notable for the following components:   RBC 3.46 (*)    Hemoglobin 10.3 (*)    HCT 32.6 (*)    All other components within  normal limits    EKG None  Radiology No results found.  CT Knee Right Wo Contrast  Final Result    MR BRAIN WO CONTRAST  Final Result    DG Chest 2 View  Final Result    DG Knee Complete 4 Views Left  Final Result    DG Knee Complete 4 Views Right  Final Result    DG Elbow Complete Left  Final Result    DG Pelvis 1-2 Views  Final Result    CT Head Wo Contrast  Final Result    CT Cervical Spine Wo Contrast  Final Result       Procedures Procedures (including critical care time)  Medications Ordered in ED Medications  acetaminophen (TYLENOL) tablet 650 mg (has no administration in time range)     Initial Impression / Assessment and Plan / ED Course  I have reviewed the triage vital signs and the nursing notes.  Pertinent labs & imaging results that were available during my care of the patient were reviewed by me and considered in my medical decision making (see chart for details).  Clinical Course as of Sep 29 1346  Fri Sep 26, 2017  1018 contacted the son who is power of attorney and updated him on the abnormality on CT and he is comfortable with [place] getting an MRI to  further evaluate this area.   [MB]  1019 Right knee plain film showing possible fracture recommending CT which is been ordered.     [MB]  1226 Discussed with Dr. Napoleon Form neurosurgery who feels that the findings on the MRI would not require any intervention.  He felt that her age that the risks would outweigh any benefit.   [MB]  73 Patient's son is here now and I reviewed the findings.  He is comfortable with her just returning the facility.  He also agrees that he would not want her to undergo any kind of intervention as far as this brain aneurysm.   [MB]    Clinical Course User Index [MB] Hayden Rasmussen, MD    Final Clinical Impressions(s) / ED Diagnoses   Final diagnoses:  Fall, initial encounter  Brain aneurysm  Multiple contusions    ED Discharge Orders    None       Hayden Rasmussen, MD 09/28/17 1349

## 2017-09-26 NOTE — ED Notes (Signed)
Patient returned from radiology

## 2017-09-29 ENCOUNTER — Telehealth: Payer: Self-pay

## 2017-09-29 DIAGNOSIS — M19079 Primary osteoarthritis, unspecified ankle and foot: Secondary | ICD-10-CM | POA: Diagnosis not present

## 2017-09-29 DIAGNOSIS — R2681 Unsteadiness on feet: Secondary | ICD-10-CM | POA: Diagnosis not present

## 2017-09-29 DIAGNOSIS — L608 Other nail disorders: Secondary | ICD-10-CM | POA: Diagnosis not present

## 2017-09-29 DIAGNOSIS — M79673 Pain in unspecified foot: Secondary | ICD-10-CM | POA: Diagnosis not present

## 2017-09-29 NOTE — Telephone Encounter (Signed)
Patient's daughter in law called me back. Patient is currently residing at [facility] and is seeing the provider there. She said if she ever leaves she will come back to [facility] but for now she is following up with the doctor at [facility]

## 2017-09-29 NOTE — Telephone Encounter (Signed)
Ok, thanks, I suspect she's long-term there.  I will remove my name at this point from her chart as PCP.

## 2017-09-29 NOTE — Telephone Encounter (Signed)
Called to see if patient is still a patient here at [facility] senior [facility] and if she has a hospital follow up visit scheduled at a different facility. There was no answer- I left voicemail and call back number

## 2017-09-30 DIAGNOSIS — I5040 Unspecified combined systolic (congestive) and diastolic (congestive) heart failure: Secondary | ICD-10-CM | POA: Diagnosis not present

## 2017-12-01 DIAGNOSIS — N183 Chronic kidney disease, stage 3 (moderate): Secondary | ICD-10-CM | POA: Diagnosis not present

## 2017-12-01 DIAGNOSIS — M79673 Pain in unspecified foot: Secondary | ICD-10-CM | POA: Diagnosis not present

## 2017-12-01 DIAGNOSIS — R2681 Unsteadiness on feet: Secondary | ICD-10-CM | POA: Diagnosis not present

## 2017-12-01 DIAGNOSIS — L603 Nail dystrophy: Secondary | ICD-10-CM | POA: Diagnosis not present

## 2017-12-19 DIAGNOSIS — M6281 Muscle weakness (generalized): Secondary | ICD-10-CM | POA: Diagnosis not present

## 2017-12-19 DIAGNOSIS — R296 Repeated falls: Secondary | ICD-10-CM | POA: Diagnosis not present

## 2017-12-19 DIAGNOSIS — R260 Ataxic gait: Secondary | ICD-10-CM | POA: Diagnosis not present

## 2017-12-19 DIAGNOSIS — Z7982 Long term (current) use of aspirin: Secondary | ICD-10-CM | POA: Diagnosis not present

## 2017-12-19 DIAGNOSIS — Z79891 Long term (current) use of opiate analgesic: Secondary | ICD-10-CM | POA: Diagnosis not present

## 2017-12-23 DIAGNOSIS — Z7982 Long term (current) use of aspirin: Secondary | ICD-10-CM | POA: Diagnosis not present

## 2017-12-23 DIAGNOSIS — Z79891 Long term (current) use of opiate analgesic: Secondary | ICD-10-CM | POA: Diagnosis not present

## 2017-12-23 DIAGNOSIS — M6281 Muscle weakness (generalized): Secondary | ICD-10-CM | POA: Diagnosis not present

## 2017-12-23 DIAGNOSIS — R260 Ataxic gait: Secondary | ICD-10-CM | POA: Diagnosis not present

## 2017-12-23 DIAGNOSIS — R296 Repeated falls: Secondary | ICD-10-CM | POA: Diagnosis not present

## 2017-12-26 DIAGNOSIS — R296 Repeated falls: Secondary | ICD-10-CM | POA: Diagnosis not present

## 2017-12-26 DIAGNOSIS — Z79891 Long term (current) use of opiate analgesic: Secondary | ICD-10-CM | POA: Diagnosis not present

## 2017-12-26 DIAGNOSIS — R260 Ataxic gait: Secondary | ICD-10-CM | POA: Diagnosis not present

## 2017-12-26 DIAGNOSIS — Z7982 Long term (current) use of aspirin: Secondary | ICD-10-CM | POA: Diagnosis not present

## 2017-12-26 DIAGNOSIS — M6281 Muscle weakness (generalized): Secondary | ICD-10-CM | POA: Diagnosis not present

## 2017-12-29 DIAGNOSIS — M6281 Muscle weakness (generalized): Secondary | ICD-10-CM | POA: Diagnosis not present

## 2017-12-29 DIAGNOSIS — R260 Ataxic gait: Secondary | ICD-10-CM | POA: Diagnosis not present

## 2017-12-29 DIAGNOSIS — R296 Repeated falls: Secondary | ICD-10-CM | POA: Diagnosis not present

## 2017-12-29 DIAGNOSIS — Z7982 Long term (current) use of aspirin: Secondary | ICD-10-CM | POA: Diagnosis not present

## 2017-12-29 DIAGNOSIS — Z79891 Long term (current) use of opiate analgesic: Secondary | ICD-10-CM | POA: Diagnosis not present

## 2018-01-01 DIAGNOSIS — R296 Repeated falls: Secondary | ICD-10-CM | POA: Diagnosis not present

## 2018-01-01 DIAGNOSIS — M6281 Muscle weakness (generalized): Secondary | ICD-10-CM | POA: Diagnosis not present

## 2018-01-01 DIAGNOSIS — Z79891 Long term (current) use of opiate analgesic: Secondary | ICD-10-CM | POA: Diagnosis not present

## 2018-01-01 DIAGNOSIS — R260 Ataxic gait: Secondary | ICD-10-CM | POA: Diagnosis not present

## 2018-01-01 DIAGNOSIS — Z7982 Long term (current) use of aspirin: Secondary | ICD-10-CM | POA: Diagnosis not present

## 2018-01-07 DIAGNOSIS — R296 Repeated falls: Secondary | ICD-10-CM | POA: Diagnosis not present

## 2018-01-07 DIAGNOSIS — R260 Ataxic gait: Secondary | ICD-10-CM | POA: Diagnosis not present

## 2018-01-07 DIAGNOSIS — Z79891 Long term (current) use of opiate analgesic: Secondary | ICD-10-CM | POA: Diagnosis not present

## 2018-01-07 DIAGNOSIS — Z7982 Long term (current) use of aspirin: Secondary | ICD-10-CM | POA: Diagnosis not present

## 2018-01-07 DIAGNOSIS — M6281 Muscle weakness (generalized): Secondary | ICD-10-CM | POA: Diagnosis not present

## 2018-01-15 DIAGNOSIS — Z7982 Long term (current) use of aspirin: Secondary | ICD-10-CM | POA: Diagnosis not present

## 2018-01-15 DIAGNOSIS — M6281 Muscle weakness (generalized): Secondary | ICD-10-CM | POA: Diagnosis not present

## 2018-01-15 DIAGNOSIS — R260 Ataxic gait: Secondary | ICD-10-CM | POA: Diagnosis not present

## 2018-01-15 DIAGNOSIS — R296 Repeated falls: Secondary | ICD-10-CM | POA: Diagnosis not present

## 2018-01-15 DIAGNOSIS — Z79891 Long term (current) use of opiate analgesic: Secondary | ICD-10-CM | POA: Diagnosis not present

## 2018-01-20 DIAGNOSIS — Z79891 Long term (current) use of opiate analgesic: Secondary | ICD-10-CM | POA: Diagnosis not present

## 2018-01-20 DIAGNOSIS — M6281 Muscle weakness (generalized): Secondary | ICD-10-CM | POA: Diagnosis not present

## 2018-01-20 DIAGNOSIS — Z7982 Long term (current) use of aspirin: Secondary | ICD-10-CM | POA: Diagnosis not present

## 2018-01-20 DIAGNOSIS — R296 Repeated falls: Secondary | ICD-10-CM | POA: Diagnosis not present

## 2018-01-20 DIAGNOSIS — R260 Ataxic gait: Secondary | ICD-10-CM | POA: Diagnosis not present

## 2018-02-02 DIAGNOSIS — M79673 Pain in unspecified foot: Secondary | ICD-10-CM | POA: Diagnosis not present

## 2018-02-02 DIAGNOSIS — N183 Chronic kidney disease, stage 3 (moderate): Secondary | ICD-10-CM | POA: Diagnosis not present

## 2018-02-02 DIAGNOSIS — R2681 Unsteadiness on feet: Secondary | ICD-10-CM | POA: Diagnosis not present

## 2018-02-02 DIAGNOSIS — L608 Other nail disorders: Secondary | ICD-10-CM | POA: Diagnosis not present

## 2018-02-18 DIAGNOSIS — Z79899 Other long term (current) drug therapy: Secondary | ICD-10-CM | POA: Diagnosis not present

## 2018-02-18 DIAGNOSIS — D649 Anemia, unspecified: Secondary | ICD-10-CM | POA: Diagnosis not present

## 2018-04-06 DIAGNOSIS — L603 Nail dystrophy: Secondary | ICD-10-CM | POA: Diagnosis not present

## 2018-04-06 DIAGNOSIS — N183 Chronic kidney disease, stage 3 (moderate): Secondary | ICD-10-CM | POA: Diagnosis not present

## 2018-04-06 DIAGNOSIS — L851 Acquired keratosis [keratoderma] palmaris et plantaris: Secondary | ICD-10-CM | POA: Diagnosis not present

## 2018-04-06 DIAGNOSIS — R2681 Unsteadiness on feet: Secondary | ICD-10-CM | POA: Diagnosis not present

## 2018-04-06 DIAGNOSIS — M79673 Pain in unspecified foot: Secondary | ICD-10-CM | POA: Diagnosis not present

## 2018-06-08 DIAGNOSIS — B351 Tinea unguium: Secondary | ICD-10-CM | POA: Diagnosis not present

## 2018-06-08 DIAGNOSIS — L603 Nail dystrophy: Secondary | ICD-10-CM | POA: Diagnosis not present

## 2018-06-08 DIAGNOSIS — R2681 Unsteadiness on feet: Secondary | ICD-10-CM | POA: Diagnosis not present

## 2018-06-08 DIAGNOSIS — N183 Chronic kidney disease, stage 3 (moderate): Secondary | ICD-10-CM | POA: Diagnosis not present

## 2018-06-08 DIAGNOSIS — L84 Corns and callosities: Secondary | ICD-10-CM | POA: Diagnosis not present

## 2018-06-08 DIAGNOSIS — M79673 Pain in unspecified foot: Secondary | ICD-10-CM | POA: Diagnosis not present

## 2018-07-22 DIAGNOSIS — I509 Heart failure, unspecified: Secondary | ICD-10-CM | POA: Diagnosis not present

## 2018-07-27 DIAGNOSIS — D649 Anemia, unspecified: Secondary | ICD-10-CM | POA: Diagnosis not present

## 2018-07-27 DIAGNOSIS — E039 Hypothyroidism, unspecified: Secondary | ICD-10-CM | POA: Diagnosis not present

## 2018-07-27 DIAGNOSIS — Z79899 Other long term (current) drug therapy: Secondary | ICD-10-CM | POA: Diagnosis not present

## 2018-11-17 DIAGNOSIS — I2510 Atherosclerotic heart disease of native coronary artery without angina pectoris: Secondary | ICD-10-CM | POA: Diagnosis not present

## 2018-11-17 DIAGNOSIS — R32 Unspecified urinary incontinence: Secondary | ICD-10-CM | POA: Diagnosis not present

## 2018-11-17 DIAGNOSIS — F341 Dysthymic disorder: Secondary | ICD-10-CM | POA: Diagnosis not present

## 2018-11-17 DIAGNOSIS — E039 Hypothyroidism, unspecified: Secondary | ICD-10-CM | POA: Diagnosis not present

## 2018-11-17 DIAGNOSIS — I130 Hypertensive heart and chronic kidney disease with heart failure and stage 1 through stage 4 chronic kidney disease, or unspecified chronic kidney disease: Secondary | ICD-10-CM | POA: Diagnosis not present

## 2018-11-17 DIAGNOSIS — E559 Vitamin D deficiency, unspecified: Secondary | ICD-10-CM | POA: Diagnosis not present

## 2018-11-17 DIAGNOSIS — Z9181 History of falling: Secondary | ICD-10-CM | POA: Diagnosis not present

## 2018-11-17 DIAGNOSIS — Z853 Personal history of malignant neoplasm of breast: Secondary | ICD-10-CM | POA: Diagnosis not present

## 2018-11-17 DIAGNOSIS — N183 Chronic kidney disease, stage 3 (moderate): Secondary | ICD-10-CM | POA: Diagnosis not present

## 2018-11-17 DIAGNOSIS — M810 Age-related osteoporosis without current pathological fracture: Secondary | ICD-10-CM | POA: Diagnosis not present

## 2018-11-17 DIAGNOSIS — I5032 Chronic diastolic (congestive) heart failure: Secondary | ICD-10-CM | POA: Diagnosis not present

## 2018-11-17 DIAGNOSIS — Z87440 Personal history of urinary (tract) infections: Secondary | ICD-10-CM | POA: Diagnosis not present

## 2018-11-17 DIAGNOSIS — M6281 Muscle weakness (generalized): Secondary | ICD-10-CM | POA: Diagnosis not present

## 2018-11-17 DIAGNOSIS — Z993 Dependence on wheelchair: Secondary | ICD-10-CM | POA: Diagnosis not present

## 2018-11-17 DIAGNOSIS — I700 Atherosclerosis of aorta: Secondary | ICD-10-CM | POA: Diagnosis not present

## 2018-11-18 ENCOUNTER — Emergency Department (HOSPITAL_COMMUNITY): Payer: Medicare Other

## 2018-11-18 ENCOUNTER — Inpatient Hospital Stay (HOSPITAL_COMMUNITY)
Admission: EM | Admit: 2018-11-18 | Discharge: 2018-12-19 | DRG: 065 | Disposition: E | Payer: Medicare Other | Attending: Internal Medicine | Admitting: Internal Medicine

## 2018-11-18 DIAGNOSIS — R0689 Other abnormalities of breathing: Secondary | ICD-10-CM | POA: Diagnosis not present

## 2018-11-18 DIAGNOSIS — I671 Cerebral aneurysm, nonruptured: Secondary | ICD-10-CM | POA: Diagnosis present

## 2018-11-18 DIAGNOSIS — N184 Chronic kidney disease, stage 4 (severe): Secondary | ICD-10-CM | POA: Diagnosis present

## 2018-11-18 DIAGNOSIS — F411 Generalized anxiety disorder: Secondary | ICD-10-CM | POA: Diagnosis present

## 2018-11-18 DIAGNOSIS — I739 Peripheral vascular disease, unspecified: Secondary | ICD-10-CM | POA: Diagnosis present

## 2018-11-18 DIAGNOSIS — Z823 Family history of stroke: Secondary | ICD-10-CM

## 2018-11-18 DIAGNOSIS — Z853 Personal history of malignant neoplasm of breast: Secondary | ICD-10-CM

## 2018-11-18 DIAGNOSIS — Z66 Do not resuscitate: Secondary | ICD-10-CM | POA: Diagnosis not present

## 2018-11-18 DIAGNOSIS — R000 Tachycardia, unspecified: Secondary | ICD-10-CM | POA: Diagnosis not present

## 2018-11-18 DIAGNOSIS — Z515 Encounter for palliative care: Secondary | ICD-10-CM | POA: Diagnosis not present

## 2018-11-18 DIAGNOSIS — N189 Chronic kidney disease, unspecified: Secondary | ICD-10-CM

## 2018-11-18 DIAGNOSIS — I639 Cerebral infarction, unspecified: Secondary | ICD-10-CM | POA: Diagnosis present

## 2018-11-18 DIAGNOSIS — M810 Age-related osteoporosis without current pathological fracture: Secondary | ICD-10-CM | POA: Diagnosis present

## 2018-11-18 DIAGNOSIS — Z7189 Other specified counseling: Secondary | ICD-10-CM | POA: Diagnosis not present

## 2018-11-18 DIAGNOSIS — Z03818 Encounter for observation for suspected exposure to other biological agents ruled out: Secondary | ICD-10-CM | POA: Diagnosis not present

## 2018-11-18 DIAGNOSIS — Z79890 Hormone replacement therapy (postmenopausal): Secondary | ICD-10-CM

## 2018-11-18 DIAGNOSIS — Z9012 Acquired absence of left breast and nipple: Secondary | ICD-10-CM | POA: Diagnosis not present

## 2018-11-18 DIAGNOSIS — I129 Hypertensive chronic kidney disease with stage 1 through stage 4 chronic kidney disease, or unspecified chronic kidney disease: Secondary | ICD-10-CM | POA: Diagnosis present

## 2018-11-18 DIAGNOSIS — D649 Anemia, unspecified: Secondary | ICD-10-CM

## 2018-11-18 DIAGNOSIS — I10 Essential (primary) hypertension: Secondary | ICD-10-CM | POA: Diagnosis not present

## 2018-11-18 DIAGNOSIS — G8191 Hemiplegia, unspecified affecting right dominant side: Secondary | ICD-10-CM | POA: Diagnosis present

## 2018-11-18 DIAGNOSIS — N183 Chronic kidney disease, stage 3 (moderate): Secondary | ICD-10-CM | POA: Diagnosis not present

## 2018-11-18 DIAGNOSIS — Z881 Allergy status to other antibiotic agents status: Secondary | ICD-10-CM

## 2018-11-18 DIAGNOSIS — Z993 Dependence on wheelchair: Secondary | ICD-10-CM | POA: Diagnosis not present

## 2018-11-18 DIAGNOSIS — I480 Paroxysmal atrial fibrillation: Secondary | ICD-10-CM | POA: Diagnosis present

## 2018-11-18 DIAGNOSIS — D638 Anemia in other chronic diseases classified elsewhere: Secondary | ICD-10-CM | POA: Diagnosis present

## 2018-11-18 DIAGNOSIS — R52 Pain, unspecified: Secondary | ICD-10-CM | POA: Diagnosis not present

## 2018-11-18 DIAGNOSIS — Z8249 Family history of ischemic heart disease and other diseases of the circulatory system: Secondary | ICD-10-CM

## 2018-11-18 DIAGNOSIS — R4182 Altered mental status, unspecified: Secondary | ICD-10-CM | POA: Diagnosis not present

## 2018-11-18 DIAGNOSIS — Z825 Family history of asthma and other chronic lower respiratory diseases: Secondary | ICD-10-CM

## 2018-11-18 DIAGNOSIS — Z20828 Contact with and (suspected) exposure to other viral communicable diseases: Secondary | ICD-10-CM | POA: Diagnosis present

## 2018-11-18 DIAGNOSIS — N179 Acute kidney failure, unspecified: Secondary | ICD-10-CM | POA: Diagnosis not present

## 2018-11-18 DIAGNOSIS — R4020 Unspecified coma: Secondary | ICD-10-CM | POA: Diagnosis not present

## 2018-11-18 DIAGNOSIS — I63412 Cerebral infarction due to embolism of left middle cerebral artery: Secondary | ICD-10-CM

## 2018-11-18 DIAGNOSIS — I63512 Cerebral infarction due to unspecified occlusion or stenosis of left middle cerebral artery: Secondary | ICD-10-CM | POA: Diagnosis not present

## 2018-11-18 DIAGNOSIS — R4701 Aphasia: Secondary | ICD-10-CM | POA: Diagnosis present

## 2018-11-18 DIAGNOSIS — E039 Hypothyroidism, unspecified: Secondary | ICD-10-CM | POA: Diagnosis not present

## 2018-11-18 DIAGNOSIS — R404 Transient alteration of awareness: Secondary | ICD-10-CM | POA: Diagnosis not present

## 2018-11-18 DIAGNOSIS — Z79899 Other long term (current) drug therapy: Secondary | ICD-10-CM | POA: Diagnosis not present

## 2018-11-18 DIAGNOSIS — Z7982 Long term (current) use of aspirin: Secondary | ICD-10-CM

## 2018-11-18 DIAGNOSIS — F0390 Unspecified dementia without behavioral disturbance: Secondary | ICD-10-CM | POA: Diagnosis present

## 2018-11-18 DIAGNOSIS — I491 Atrial premature depolarization: Secondary | ICD-10-CM | POA: Diagnosis not present

## 2018-11-18 DIAGNOSIS — R918 Other nonspecific abnormal finding of lung field: Secondary | ICD-10-CM | POA: Diagnosis not present

## 2018-11-18 LAB — CBC
HCT: 35.7 % — ABNORMAL LOW (ref 36.0–46.0)
Hemoglobin: 11.2 g/dL — ABNORMAL LOW (ref 12.0–15.0)
MCH: 29.9 pg (ref 26.0–34.0)
MCHC: 31.4 g/dL (ref 30.0–36.0)
MCV: 95.2 fL (ref 80.0–100.0)
Platelets: 215 10*3/uL (ref 150–400)
RBC: 3.75 MIL/uL — ABNORMAL LOW (ref 3.87–5.11)
RDW: 14.7 % (ref 11.5–15.5)
WBC: 6.4 10*3/uL (ref 4.0–10.5)
nRBC: 0 % (ref 0.0–0.2)

## 2018-11-18 LAB — URINALYSIS, ROUTINE W REFLEX MICROSCOPIC
Bilirubin Urine: NEGATIVE
Glucose, UA: NEGATIVE mg/dL
Hgb urine dipstick: NEGATIVE
Ketones, ur: NEGATIVE mg/dL
Leukocytes,Ua: NEGATIVE
Nitrite: NEGATIVE
Protein, ur: NEGATIVE mg/dL
Specific Gravity, Urine: 1.008 (ref 1.005–1.030)
pH: 5 (ref 5.0–8.0)

## 2018-11-18 LAB — COMPREHENSIVE METABOLIC PANEL
ALT: 12 U/L (ref 0–44)
AST: 19 U/L (ref 15–41)
Albumin: 3.4 g/dL — ABNORMAL LOW (ref 3.5–5.0)
Alkaline Phosphatase: 54 U/L (ref 38–126)
Anion gap: 16 — ABNORMAL HIGH (ref 5–15)
BUN: 78 mg/dL — ABNORMAL HIGH (ref 8–23)
CO2: 17 mmol/L — ABNORMAL LOW (ref 22–32)
Calcium: 9.5 mg/dL (ref 8.9–10.3)
Chloride: 103 mmol/L (ref 98–111)
Creatinine, Ser: 3.48 mg/dL — ABNORMAL HIGH (ref 0.44–1.00)
GFR calc Af Amer: 12 mL/min — ABNORMAL LOW (ref >60)
GFR calc non Af Amer: 10 mL/min — ABNORMAL LOW (ref >60)
Glucose, Bld: 101 mg/dL — ABNORMAL HIGH (ref 70–99)
Potassium: 3.7 mmol/L (ref 3.5–5.1)
Sodium: 136 mmol/L (ref 135–145)
Total Bilirubin: 0.4 mg/dL (ref 0.3–1.2)
Total Protein: 6.9 g/dL (ref 6.5–8.1)

## 2018-11-18 LAB — DIFFERENTIAL
Abs Immature Granulocytes: 0.03 10*3/uL (ref 0.00–0.07)
Basophils Absolute: 0.1 10*3/uL (ref 0.0–0.1)
Basophils Relative: 1 %
Eosinophils Absolute: 0.4 10*3/uL (ref 0.0–0.5)
Eosinophils Relative: 6 %
Immature Granulocytes: 1 %
Lymphocytes Relative: 19 %
Lymphs Abs: 1.2 10*3/uL (ref 0.7–4.0)
Monocytes Absolute: 0.9 10*3/uL (ref 0.1–1.0)
Monocytes Relative: 13 %
Neutro Abs: 3.8 10*3/uL (ref 1.7–7.7)
Neutrophils Relative %: 60 %

## 2018-11-18 LAB — RAPID URINE DRUG SCREEN, HOSP PERFORMED
Amphetamines: NOT DETECTED
Barbiturates: NOT DETECTED
Benzodiazepines: NOT DETECTED
Cocaine: NOT DETECTED
Opiates: NOT DETECTED
Tetrahydrocannabinol: NOT DETECTED

## 2018-11-18 LAB — PROTIME-INR
INR: 1.2 (ref 0.8–1.2)
Prothrombin Time: 14.6 seconds (ref 11.4–15.2)

## 2018-11-18 LAB — APTT: aPTT: 26 seconds (ref 24–36)

## 2018-11-18 LAB — ETHANOL: Alcohol, Ethyl (B): <10 mg/dL (ref <10)

## 2018-11-18 MED ORDER — ACETAMINOPHEN 325 MG PO TABS: 650.0000 mg | ORAL_TABLET | ORAL | Status: DC | PRN

## 2018-11-18 MED ORDER — ACETAMINOPHEN 650 MG RE SUPP: 650.0000 mg | RECTAL | Status: DC | PRN

## 2018-11-18 MED ORDER — SODIUM CHLORIDE 0.9 % IV SOLN
0.5000 mg/h | INTRAVENOUS | Status: DC
  Administered 2018-11-18: 0.5 mg/h via INTRAVENOUS
  Filled 2018-11-18: qty 5

## 2018-11-18 MED ORDER — ONDANSETRON HCL 4 MG/2ML IJ SOLN: 4.0000 mg | Freq: Four times a day (QID) | INTRAMUSCULAR | Status: DC | PRN

## 2018-11-18 MED ORDER — STROKE: EARLY STAGES OF RECOVERY BOOK: Freq: Once | Status: DC

## 2018-11-18 MED ORDER — GLYCOPYRROLATE 0.2 MG/ML IJ SOLN
0.3000 mg | INTRAMUSCULAR | Status: DC | PRN
  Administered 2018-11-18: 0.3 mg via INTRAVENOUS
  Filled 2018-11-18: qty 2

## 2018-11-18 MED ORDER — LORAZEPAM 2 MG/ML IJ SOLN: 1.0000 mg | INTRAMUSCULAR | Status: DC | PRN

## 2018-11-18 MED ORDER — HEPARIN SODIUM (PORCINE) 5000 UNIT/ML IJ SOLN: 5000.0000 [IU] | Freq: Three times a day (TID) | INTRAMUSCULAR | Status: DC

## 2018-11-18 MED ORDER — HYDROMORPHONE BOLUS VIA INFUSION
0.5000 mg | INTRAVENOUS | Status: DC | PRN
  Administered 2018-11-18: 0.5 mg via INTRAVENOUS
  Filled 2018-11-18: qty 1

## 2018-11-18 MED ORDER — ACETAMINOPHEN 160 MG/5ML PO SOLN: 650.0000 mg | ORAL | Status: DC | PRN

## 2018-11-18 MED ORDER — POLYVINYL ALCOHOL 1.4 % OP SOLN
1.0000 [drp] | Freq: Four times a day (QID) | OPHTHALMIC | Status: DC | PRN
  Filled 2018-11-18: qty 15

## 2018-11-18 MED ORDER — MORPHINE SULFATE (PF) 2 MG/ML IV SOLN
2.0000 mg | INTRAVENOUS | Status: DC | PRN
  Administered 2018-11-18: 2 mg via INTRAVENOUS
  Filled 2018-11-18: qty 1

## 2018-11-18 MED ORDER — BIOTENE DRY MOUTH MT LIQD: 15.0000 mL | OROMUCOSAL | Status: DC | PRN

## 2018-11-18 MED ORDER — SODIUM CHLORIDE 0.9 % IV SOLN: INTRAVENOUS | Status: DC

## 2018-11-19 DIAGNOSIS — I63412 Cerebral infarction due to embolism of left middle cerebral artery: Secondary | ICD-10-CM | POA: Diagnosis not present

## 2018-11-19 DIAGNOSIS — I480 Paroxysmal atrial fibrillation: Secondary | ICD-10-CM | POA: Diagnosis not present

## 2018-11-19 LAB — NOVEL CORONAVIRUS, NAA (HOSP ORDER, SEND-OUT TO REF LAB; TAT 18-24 HRS): SARS-CoV-2, NAA: NOT DETECTED

## 2018-11-19 NOTE — Progress Notes (Signed)
Patient was on IV Dilaudid infusion 27ml/hr, at 2155 pt stopped breathing, no pulse, pupil were  fixed and non reactive, family at bedside,  pt was DNR comfort care, verified  the time of death  with two RN Jerolyn Center, RN and Maurine Minister., RN. On call physician Chaney Malling) notified. Wasted remaining 45mg  ( 45ml ) IV Dilaudid with RN Phyl .

## 2018-12-03 DIAGNOSIS — I480 Paroxysmal atrial fibrillation: Secondary | ICD-10-CM | POA: Diagnosis present

## 2018-12-03 NOTE — Discharge Summary (Signed)
Diana Anthony QZR:[id number] DOB: 1916/12/19 DOA: 05-Dec-2018  PCP: System, Pcp Not In PCP/Office notified: No  Admit date: December 05, 2018 Date of Death: 05-Dec-2018  Final Diagnoses:  Principal Problem:   CVA (cerebral vascular accident) [facility]) Active Problems:   Acute renal failure superimposed on chronic kidney disease ([place])   Anemia of chronic disease    1. Left MCA stroke: Patient was obtunded unable to move the right side.  Although not seen on initial imaging CT neurology evaluated patient and suspected deficits consistent with left MCA territory infarct due to paroxysmal atrial fibrillation not on anticoagulation. 2. Paroxysmal atrial fibrillation: Patient was seen to be in A. fib with RVR intermittently on telemetry monitoring.  Patient not on anticoagulation.  History of present illness:  Diana Anthony is a 85 y.o. female with medical history significant of hypertension, hypothyroidism, osteoporosis, breast cancer, and anxiety; who presents after being found acutely altered.  Previously, noted to be wheelchair-bound and had last talked to her son yesterday afternoon.  Patient normally was talkative at baseline.  Son reports that over the last few weeks she had been having decreased oral intake and only eating breakfast.  Patient did not want any significant life prolonging measures and he wants her to be comfortable  Hospital Course:  Patient presented as a code stroke.  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  Neurology was consulted, but patient not a candidate for any acute intervention.  Upon admission into the emergency department patient was noted to be afebrile, pulse 50-71, respirations 16-27, normal limits.  Labs revealed WBC 6.4, hemoglobin 11.2, BUN 78, and creatinine 3.48 (baseline previously 1.6-1.8).  [facility] was called to admit for suspected stroke.  Palliative care was consulted.   After patient son talked with neurology he agreed with making the patient comfort care given poor overall prognosis.  Orders that were not comfort care were discontinued.  Patient was started on a Dilaudid drip and 2 mL/h for comfort with other supportive care measures.  Nursing staff noted patient had stopped breathing with no pulse, and fixed pupils and 21:55 on day of admission.   Time of death 2153-08-25 2018-12-05  Signed:  Norval Morton  [facility] 12/03/2018, 8:30 AM

## 2018-12-19 NOTE — Progress Notes (Signed)
CSW received consult for patient for her presenting to the ED with what appears to be a debilitating stroke. CSW monitored chart for updates and reviewed NP from Palliative Care's note. This patient's son and daughter are at bedside and are waiting to hear an update from neurology on a plan for care. Patient is now a DNR and has a poor prognosis. Please consult spiritual care services for emotional or grief support if needed. No CSW needs at this time, please consult if needs arise.  Madilyn Fireman, MSW, LCSW-A Clinical Social Worker [facility] [phone]

## 2018-12-19 NOTE — ED Notes (Signed)
Pt transported by transporter w/ family.

## 2018-12-19 NOTE — ED Notes (Signed)
Pt breathing has become more agonal, admitting aware. Son and Daughter in law are at bedside. Pt monitor changed to comfort care. Pt appears more comfortable following morphine.

## 2018-12-19 NOTE — Progress Notes (Signed)
Pt arrived to unit. Pt has comfort care orders. Accompanied by son and daughter in law. Comfort care tray ordered.

## 2018-12-19 NOTE — ED Notes (Signed)
Pt offered services by chaplin, POA declined.

## 2018-12-19 NOTE — Progress Notes (Signed)
Son was updated by neurology and would like to proceed with comfort care.  Palliative care medicine updated regarding plan as well.

## 2018-12-19 NOTE — ED Provider Notes (Signed)
[facility] EMERGENCY DEPARTMENT Provider Note   CSN: [id number] Arrival date & time: 23-Nov-2018  8466   History   Chief Complaint Chief Complaint  Patient presents with   Altered Mental Status    HPI SUMMER MCCOLGAN is a 85 y.o. female.   The history is provided by the nursing home and a relative. The history is limited by the condition of the patient (Patient nonverbal).  Altered Mental Status She has history of hypertension, hypothyroidism, dementia and is brought in by ambulance because of altered mentation.  She was reported to have last been seen normal at about midnight.  She is normally [place] and verbal.  Her son is here he states that he has not seen her since March because of COVID-19, but she is normally able to speak with him on the phone.  Patient is completely nonverbal during this visit.  Past Medical History:  Diagnosis Date   Anxiety state, unspecified    Dysthymic disorder    First degree atrioventricular block    Hyperglycemia 01/30/09   Malignant neoplasm of breast (female), unspecified site    Osteoarthrosis, unspecified whether generalized or localized, unspecified site    Senile osteoporosis    Trigger finger (acquired)    Unspecified constipation    Unspecified essential hypertension    Unspecified hypothyroidism    Unspecified vitamin D deficiency     Patient Active Problem List   Diagnosis Date Noted   UTI (urinary tract infection) 05/31/2017   Closed fracture of right superior rim of pubis ([place]) 05/30/2017   Closed fracture of right inferior pubic ramus ([place]) 05/30/2017   Compression fracture of L1 lumbar vertebra ([place]) 05/30/2017   Spondylolisthesis, lumbar region 05/30/2017   Chronic kidney disease (CKD) stage G2/A2, mildly decreased glomerular filtration rate (GFR) between 60-89 mL/min/1.73 square meter and albuminuria creatinine ratio between 30-299 mg/g 01/31/2015   Memory impairment 01/31/2015    Osteoarthritis of right knee 02/24/2014   Urinary tract infection, site not specified 12/16/2012   Unspecified vitamin D deficiency    Anxiety state    History of breast cancer in female    Hypothyroidism    Essential hypertension    Hyperglycemia 01/30/2009    Past Surgical History:  Procedure Laterality Date   CATARACT EXTRACTION BILATERAL W/ ANTERIOR VITRECTOMY Bilateral 2002   MASTECTOMY Left 1986   Dr.Weatherly    ORIF FACIAL FRACTURE  1962     OB History   No obstetric history on file.      Home Medications    Prior to Admission medications   Medication Sig Start Date End Date Taking? Authorizing Provider  acetaminophen (TYLENOL) 500 MG tablet Take 2 tablets (1,000 mg total) by mouth every 6 (six) hours as needed. Patient taking differently: Take 1,000 mg by mouth every 6 (six) hours as needed for mild pain.  05/27/17  Yes Charlesetta Shanks, MD  amLODipine-benazepril (LOTREL) 10-20 MG capsule TAKE 1 CAPSULE BY MOUTH DAILY 04/23/17  Yes Reed, Tiffany L, DO  aspirin 81 MG chewable tablet Chew 81 mg by mouth daily.   Yes [provider]  furosemide (LASIX) 20 MG tablet Take 20 mg by mouth daily.   Yes [provider]  guaiFENesin-dextromethorphan (ROBITUSSIN DM) 100-10 MG/5ML syrup Take 10 mLs by mouth every 6 (six) hours as needed for cough.   Yes [provider]  hydrALAZINE (APRESOLINE) 25 MG tablet TAKE 1 TABLET(25 MG) BY MOUTH DAILY Patient taking differently: Take 25 mg by mouth daily.  06/10/17  Yes Reed, Tiffany L, DO  levothyroxine (SYNTHROID, LEVOTHROID) 100 MCG tablet Take 1 tablet (100 mcg total) by mouth daily. 02/20/17  Yes Reed, Tiffany L, DO  metoprolol succinate (TOPROL-XL) 50 MG 24 hr tablet TAKE 1 TABLET BY MOUTH DAILY TO CONTROL BLOOD PRESSURE AND HEART RHYTHM.(TAKE WITH OR IMMEDIATELY FOLLOWING A MEAL) Patient taking differently: Take 50 mg by mouth daily.  04/09/17  Yes Blanchie Serve, MD  Vitamin D, Ergocalciferol,  (DRISDOL) 1.25 MG (50000 UT) CAPS capsule Take 50,000 Units by mouth every Friday.   Yes [provider]  aspirin EC 81 MG tablet Take 1 tablet (81 mg total) by mouth daily. Patient not taking: Reported on 2018/12/06 10/28/16   Lauree Chandler, NP  traMADol (ULTRAM) 50 MG tablet 2 tablets every 6 hours as needed for pain.  You may take with Tylenol. Patient not taking: Reported on 2018/12/06 06/03/17   Doreatha Lew, MD    Family History Family History  Problem Relation Age of Onset   Cerebrovascular Accident Mother    Hypertension Mother    Cerebrovascular Accident Father    Heart disease Sister    Emphysema Brother     Social History Social History   Tobacco Use   Smoking status: Never Smoker   Smokeless tobacco: Never Used  Substance Use Topics   Alcohol use: No    Alcohol/week: 0.0 standard drinks   Drug use: No     Allergies   Macrobid [nitrofurantoin macrocrystal]   Review of Systems Review of Systems  Unable to perform ROS: Patient nonverbal     Physical Exam Updated Vital Signs BP 136/69    Pulse 69    Temp 98.4 F (36.9 C) (Rectal)    Resp (!) 25    SpO2 95%   Physical Exam Vitals signs and nursing note reviewed.    85 year old female, resting comfortably and in no acute distress. Vital signs are significant for elevated respiratory rate. Oxygen saturation is 95%, which is normal. Head is normocephalic and atraumatic. PERRLA, EOMI. Oropharynx is clear. Neck is nontender and supple without adenopathy or JVD. Back is nontender and there is no CVA tenderness. Lungs are clear without rales, wheezes, or rhonchi. Chest is nontender. Heart has regular rate and rhythm without murmur. Abdomen is soft, flat, nontender without masses or hepatosplenomegaly and peristalsis is normoactive. Extremities have no cyanosis or edema, full range of motion is present. Skin is warm and dry without rash. Neurologic: Nonverbal but does respond to  painful stimuli.  Mild right sided facial droop noted.  Significant weakness of right arm and leg compared with left.  Bilateral Babinski response is present but much more prominent on the right.  ED Treatments / Results  Labs (all labs ordered are listed, but only abnormal results are displayed) Labs Reviewed  CBC - Abnormal; Notable for the following components:      Result Value   RBC 3.75 (*)    Hemoglobin 11.2 (*)    HCT 35.7 (*)    All other components within normal limits  COMPREHENSIVE METABOLIC PANEL - Abnormal; Notable for the following components:   CO2 17 (*)    Glucose, Bld 101 (*)    BUN 78 (*)    Creatinine, Ser 3.48 (*)    Albumin 3.4 (*)    GFR calc non Af Amer 10 (*)    GFR calc Af Amer 12 (*)    Anion gap 16 (*)    All other  components within normal limits  NOVEL CORONAVIRUS, NAA (HOSPITAL ORDER, SEND-OUT TO REF LAB)  ETHANOL  PROTIME-INR  APTT  DIFFERENTIAL  RAPID URINE DRUG SCREEN, HOSP PERFORMED  URINALYSIS, ROUTINE W REFLEX MICROSCOPIC    EKG EKG Interpretation  Date/Time:  Dec 02, 2018 06:18:33 EDT Ventricular Rate:  71 PR Interval:    QRS Duration: 108 QT Interval:  439 QTC Calculation: 478 R Axis:   40 Text Interpretation:  Sinus rhythm Multiple premature complexes, vent & supraven Left ventricular hypertrophy When compared with ECG of 05/31/2017, No significant change was found Confirmed by Delora Fuel ([id number]) on 02-Dec-2018 6:36:40 AM   Radiology Ct Head Wo Contrast  Result Date: 02-Dec-2018 CLINICAL DATA:  Loss of consciousness EXAM: CT HEAD WITHOUT CONTRAST TECHNIQUE: Contiguous axial images were obtained from the base of the skull through the vertex without intravenous contrast. COMPARISON:  09/26/2017 FINDINGS: Brain: No evidence of acute infarction, hemorrhage, hydrocephalus, extra-axial collection or mass lesion/mass effect. Chronic small vessel ischemia with confluent low-density in the cerebral white matter. Generalized atrophy.  Small remote right cerebellar infarct. Vascular: Dolichoectasia of vertebrobasilar arteries. There is a giant aneurysm from the cavernous right ICA measuring 2.5 cm, with peripheral calcification. No adjacent hemorrhage or brain edema. Skull: No acute or aggressive finding. Sinuses/Orbits: Remote left inferior orbital rim repair. Bilateral cataract resection. IMPRESSION: 1. No acute finding. 2. Atrophy and chronic small vessel ischemia. 3. Vertebrobasilar dolichoectasia and 2.5 cm right cavernous ICA aneurysm. Electronically Signed   By: Monte Fantasia M.D.   On: Dec 02, 2018 07:36   Dg Chest Port 1 View  Result Date: 12-02-18 CLINICAL DATA:  Altered mental status.  Possible stroke. EXAM: PORTABLE CHEST 1 VIEW COMPARISON:  09/26/2017; 05/31/2017 FINDINGS: Grossly unchanged cardiac silhouette and mediastinal contours given decreased lung volumes and patient rotation. Atherosclerotic plaque within thoracic aorta. Redemonstrated mild pulmonary is congestion without frank evidence of edema. No definite pleural effusion or pneumothorax. Minimal right basilar opacities, favored to represent atelectasis. No discrete focal airspace opacities. Surgical clips overlie the left axilla. No acute osseous abnormalities. IMPRESSION: Similar findings of pulmonary venous congestion without superimposed acute cardiopulmonary disease on this hypoventilated rotated examination. Electronically Signed   By: Sandi Mariscal M.D.   On: 02-Dec-2018 07:55    Procedures Procedures  Medications Ordered in ED Medications  acetaminophen (TYLENOL) suppository 650 mg (has no administration in time range)  ondansetron (ZOFRAN) injection 4 mg (has no administration in time range)  glycopyrrolate (ROBINUL) injection 0.3 mg (0.3 mg Intravenous Given December 02, 2018 1724)  antiseptic oral rinse (BIOTENE) solution 15 mL (has no administration in time range)  polyvinyl alcohol (LIQUIFILM TEARS) 1.4 % ophthalmic solution 1 drop (has no administration in  time range)  HYDROmorphone (DILAUDID) bolus via infusion 0.5-1 mg (0.5 mg Intravenous Bolus from Bag 12/02/2018 1723)  HYDROmorphone (DILAUDID) 50 mg in sodium chloride 0.9 % 100 mL (0.5 mg/mL) infusion (0.5 mg/hr Intravenous New Bag/Given 12-02-18 1712)  LORazepam (ATIVAN) injection 1 mg (has no administration in time range)     Initial Impression / Assessment and Plan / ED Course  I have reviewed the triage vital signs and the nursing notes.  Pertinent labs & imaging results that were available during my care of the patient were reviewed by me and considered in my medical decision making (see chart for details).  Apparent stroke with dense right-sided weakness and a aphasia.  Last known normal is approximately 7 hours ago, so she is clearly outside of the timeframe for thrombolytics.  This case  was discussed with Dr. Rory Percy who felt that it she is normally ambulatory with a cane or unaided that she might be a candidate for interventional radiology.  I have discussed with her son, and she is completely wheelchair-bound, so code stroke is not activated.  Old records are reviewed, and she does have several ED visits for falls, and several hospitalizations for weakness.  CT shows no evidence of bleed, evidence of none aneurysm with no evidence of leak.  Labs show mild anemia, and marked worsening of prior renal insufficiency.  She will need to be admitted.  Final Clinical Impressions(s) / ED Diagnoses   Final diagnoses:  Cerebrovascular accident (CVA), unspecified mechanism ([place])  Normochromic normocytic anemia  Acute renal failure superimposed on chronic kidney disease, unspecified CKD stage, unspecified acute renal failure type [facility])    ED Discharge Orders    None       Delora Fuel, MD 95/70/22 [id number]

## 2018-12-19 NOTE — Progress Notes (Signed)
PALLIATIVE NOTE:  Patient continues to decline and signs of respiratory distress. I spoke with Dr. Erlinda Hong with Neurology and attending provider Dr. Tamala Julian. Neurology has spoken with son and advised of poor prognosis with not available treatment options.   I spoke with son, Khaliah Barnick via phone. He verbalized understanding of his mother's prognosis and request to transition care to full comfort. He is tearful in stating he does not want her to suffer during her last days. Support provided. I discussed in detail medications for symptom management with recommendations to initiate Dilaudid drip for comfort and better control of respiratory distress. Son verbalized understanding and agreement. He is aware to anticipate hospital death. Son and daughter-in-law Karna Christmas is at the bedside and allowed to remain with patient for EOL support.   Plan:  DNR/DNI  Full Comfort Care  Will d/c orders not comfort focused.   Dilaudid drip with bolus via infusion for comfort, pain, and respiratory distress (will order dilaudid versus morphine given BUN 78 and Cr 3.48 for better symptom control)  Zofran PRN for nausea  Ativan PRN for anxiety/agitation  Robinul PRN for excessive secretions  Liquifilm tears for dry eyes  Comfort cart and spiritual support for family  PMT will continue to support and follow   Total Time: 35 min.   Greater than 50%  of this time was spent counseling and coordinating care related to the above assessment and plan.  Alda Lea, AGPCNP-BC Palliative Medicine Team  Phone: [phone] Pager: [phone] Amion: Bjorn Pippin

## 2018-12-19 NOTE — ED Triage Notes (Addendum)
Pt BIB [facility] from [facility] d/t around 0500 Pt nonverbal, contracted, warm to touch. Pt LKN [place]   EMS EKG PVCs  SpO2 88% RA upon EMS arrival. Place on 5L/O2

## 2018-12-19 NOTE — Consult Note (Signed)
Consultation Note Date: 11/23/18   Patient Name: Diana Anthony  DOB: 10/12/1916  MRN: [id number]  Age / Sex: 85 y.o., female   PCP: System, Pcp Not In Referring Physician: Norval Morton, MD   REASON FOR CONSULTATION:Establishing goals of care  Palliative Care consult requested for this 85 y.o. female with multiple medical problems including hypertension, osteoporosis, breast cancer (chemo, radiation, and left mastectomy), anxiety, and osteoarthritis. She presented to ED from [facility] after staff reported unable to awaken patient during routine morning care. 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 Patient has been seen by Neurology and per reports not a candidate for acute interventions. Palliative consulted for goals of care discussion.   Clinical Assessment and Goals of Care: I have reviewed medical records including lab results, imaging, Epic notes, and MAR, received report from the bedside RN, and assessed the patient. I met at the bedside with patient's son/POA Diana Anthony to discuss diagnosis prognosis, GOC, EOL wishes, disposition and options. Patient remains unresponsive. Loud snoring noted with use of accessory muscles. Some random movement of left arm noted.   I introduced Palliative Medicine as specialized medical care for people living with serious illness. It focuses on providing relief from the symptoms and stress of a serious illness. The goal is to improve quality of life for both the patient and the family.  We discussed a brief life review of the patient, along with her functional and nutritional status. Son reports patient is a widow. He is the only child. Patient retired after working many years at [employer]. Son reports she enjoys spending time with family, being outside, and playing bingo or dominos.   Patient has been a resident at [facility] facility for almost 2 years. Son  reports she was living in a retirement community prior to [facility]. She suffered a fall and broken pelvis requiring her transition to [facility]. He reports she requires assistance with movement and would get up in the wheelchair in the mornings and remain up until she wanted to take a nap. She could maneuver around her living areas with her feet. He reports she was alert and oriented prior to admission and able to engage in conversations. He last saw his mom yesterday in which she was of normal behavior and also spoke with her before she went to bed. He has not been able to go into the facility to see her but visits daily from the outside. He endorses a decrease in appetite over the past 1-2 months. She would often eat breakfast and no further meals or snacks throughout the day, stating she did not have an appetite.   We discussed Her current illness and what it means in the larger context of Her on-going co-morbidities. With specific discussions regarding her acute CVA and unresponsiveness.  Natural disease trajectory and expectations at EOL were discussed. Son is tearful and verbalizes understanding of her condition. He shares that it is hard to see her like this after spending time with her yesterday and seeing her in her normal state. He states "I guess this is why it is always important to say how you feel and that you love your loved ones because you just never know!" Emotional support provided.   He reports his mother has lived a long, prosperous, and blessed life. He is grateful to have her here at 85 years old. He was hopeful she would live to see  100 but knows this is not likely given her current illness. He does not want her to suffer and wants her to be comfortable. He reports "she always said that she could only hope that she would pass away in a peaceful sleep and not suffer. It looks like God granted her wish because she looks as if she is resting peacefully!"   I attempted to elicit values and goals  of care important to the patient.    The difference between aggressive medical intervention and comfort care was considered in light of the patient's goals of care. Son states he would not want any aggressive medical interventions such as artificial feedings. He is requesting to continue with current care until he is able to get an update from Neurology and he would then be prepared to transition care to a more comfort approach after being reassured that there are no available medical options etc.   I educated son on what comfort care measures would look like. I discussed with him patient would no longer receive aggressive medical interventions such as continuous vital signs, lab work, radiology testing, or medications not focused on comfort. All care will focus on how the patient is looking and feeling. This will include management of any symptoms that may cause discomfort, pain, shortness of breath, cough, nausea, agitation, anxiety, and/or secretions etc. Symptoms will be managed with medications and other non-pharmacological interventions such as spiritual support if requested, repositioning, music therapy, or therapeutic listening. Family verbalized understanding and appreciation. Son shares this is most likely the care he would like but again would like to hear from Neurology before making final decisions.   Patient does have some notable use of accessory muscles with breathing and use of neck muscles. Explained to son would recommend morphine to assist with signs of respiratory distress. He verbalized understanding and states he would like to wait until Neurology provides update as he would not want to alter their assessment or recommendations.   Son confirms DNR/DNI wishes. Patient does have an advance directive. Son states her wishes would not be for a artificial feedings such as a PEG or dialysis.   Hospice services outpatient were explained and offered. Son verbalized his understanding and  awareness of hospice's goals and philosophy of care. I discussed residential hospice facility with him also. He states once she is transitioned to full comfort if she does not pass away in the hospital he is open to a hospice home facility. He states he resides in [place] and if able to transfer whichever facility would be closest to him.   Questions and concerns were addressed. The family was encouraged to call with questions or concerns.  PMT will continue to support holistically.   SOCIAL HISTORY:     reports that she has never smoked. She has never used smokeless tobacco. She reports that she does not drink alcohol or use drugs.  CODE STATUS: DNR  ADVANCE DIRECTIVES: Diana Anthony [facility])    SYMPTOM MANAGEMENT: per attending   Palliative Prophylaxis:   Aspiration, Frequent Pain Assessment, Oral Care and Turn Reposition  PSYCHO-SOCIAL/SPIRITUAL:  Support System: Family  Desire for further Chaplaincy support: Yes   Additional Recommendations (Limitations, Scope, Preferences):  Full Scope Treatment, No Artificial Feeding, No Hemodialysis and no escalation of care   PAST MEDICAL HISTORY: Past Medical History:  Diagnosis Date  . Anxiety state, unspecified   . Dysthymic disorder   . First degree atrioventricular block   . Hyperglycemia 01/30/09  . Malignant neoplasm of  breast (female), unspecified site   . Osteoarthrosis, unspecified whether generalized or localized, unspecified site   . Senile osteoporosis   . Trigger finger (acquired)   . Unspecified constipation   . Unspecified essential hypertension   . Unspecified hypothyroidism   . Unspecified vitamin D deficiency     PAST SURGICAL HISTORY:  Past Surgical History:  Procedure Laterality Date  . CATARACT EXTRACTION BILATERAL W/ ANTERIOR VITRECTOMY Bilateral 2002  . MASTECTOMY Left 1986   Dr.Weatherly   . ORIF FACIAL FRACTURE  1962    ALLERGIES:  is allergic to macrobid [nitrofurantoin macrocrystal].    MEDICATIONS:  No current facility-administered medications for this encounter.    Current Outpatient Medications  Medication Sig Dispense Refill  . acetaminophen (TYLENOL) 500 MG tablet Take 2 tablets (1,000 mg total) by mouth every 6 (six) hours as needed. (Patient taking differently: Take 1,000 mg by mouth every 6 (six) hours as needed for mild pain. ) 30 tablet 0  . amLODipine-benazepril (LOTREL) 10-20 MG capsule TAKE 1 CAPSULE BY MOUTH DAILY 90 capsule 1  . aspirin 81 MG chewable tablet Chew 81 mg by mouth daily.    . furosemide (LASIX) 20 MG tablet Take 20 mg by mouth daily.    Marland Kitchen guaiFENesin-dextromethorphan (ROBITUSSIN DM) 100-10 MG/5ML syrup Take 10 mLs by mouth every 6 (six) hours as needed for cough.    . hydrALAZINE (APRESOLINE) 25 MG tablet TAKE 1 TABLET(25 MG) BY MOUTH DAILY (Patient taking differently: Take 25 mg by mouth daily. ) 90 tablet 0  . levothyroxine (SYNTHROID, LEVOTHROID) 100 MCG tablet Take 1 tablet (100 mcg total) by mouth daily. 90 tablet 3  . metoprolol succinate (TOPROL-XL) 50 MG 24 hr tablet TAKE 1 TABLET BY MOUTH DAILY TO CONTROL BLOOD PRESSURE AND HEART RHYTHM.(TAKE WITH OR IMMEDIATELY FOLLOWING A MEAL) (Patient taking differently: Take 50 mg by mouth daily. ) 90 tablet 1  . Vitamin D, Ergocalciferol, (DRISDOL) 1.25 MG (50000 UT) CAPS capsule Take 50,000 Units by mouth every Friday.    Marland Kitchen aspirin EC 81 MG tablet Take 1 tablet (81 mg total) by mouth daily. (Patient not taking: Reported on 2018/12/02)    . traMADol (ULTRAM) 50 MG tablet 2 tablets every 6 hours as needed for pain.  You may take with Tylenol. (Patient not taking: Reported on 12-02-2018) 20 tablet 0    VITAL SIGNS: BP 120/70   Pulse 72   Temp 98.4 F (36.9 C) (Rectal)   Resp 17   SpO2 93%  There were no vitals filed for this visit.  Estimated body mass index is 25.69 kg/m as calculated from the following:   Height as of 05/31/17: '5\' 3"'  (1.6 m).   Weight as of 05/31/17: 65.8 kg.  LABS: CBC:     Component Value Date/Time   WBC 6.4 2018-12-02 0648   HGB 11.2 (L) 12/02/2018 0648   HGB 12.2 10/10/2014 1523   HCT 35.7 (L) 12-02-18 0648   HCT 36.1 10/10/2014 1523   PLT 215 2018-12-02 0648   PLT 238 10/10/2014 1523   Comprehensive Metabolic Panel:    Component Value Date/Time   NA 136 12-02-2018 0648   NA 138 08/07/2015 0916   K 3.7 2018-12-02 0648   CO2 17 (L) 12-02-18 0648   BUN 78 (H) 12/02/18 0648   BUN 23 08/07/2015 0916   CREATININE 3.48 (H) 12/02/2018 0648   CREATININE 2.01 (H) 05/30/2017 1004   ALBUMIN 3.4 (L) Dec 02, 2018 0648   ALBUMIN 4.1 08/07/2015 0916  Review of Systems  Unable to perform ROS: Patient unresponsive   Physical Exam General: Use of accessory muscles, frail ill-appearing, well-developed Cardiovascular: regular rate and extra beat, murmur, + pedal pulses Pulmonary: diminished, accessory muscle use  Abdomen: soft, + bowel sounds Extremities: no edema, no joint deformities Skin: no rashes Neurological: Unresponsive, moves left upper and lower extremity randomly   Prognosis: Poor (hours to days) in the setting of acute significant left-sided stroke, carotid artery aneurysm, right hemiparesis, acute on chronic renal failure stage IV (BUN 78, Cr 3.48), anemia, unresponsive, poor po intake.   Discharge Planning:  To Be Determined  Recommendations:  DNR/DNI-as confirmed by son/poa  Continue current care plan per attending.   Son is awaiting updates from Neurology before making further decisions. Leaning towards comfort care and hospice home vs. Anticipated hospital death. No aggressive medical interventions, artificial feedings, or dialysis.   Recommended transitioning to comfort based on expressed goals and use of morphine prn for respiratory distress/pain. Some accessory muscle use noted.   Given criticalness of patient's condition, appropriate for son and daughter to be at the bedside with patient Diana Anthony and Diana Anthony).   PMT will  continue to follow and support.    Palliative Performance Scale: UNRESPONSIVE, S/P ACUTE CVA              Family expressed understanding and was in agreement with this plan.   Thank you for allowing the Palliative Medicine Team to assist in the care of this patient.  Time In: 1200 Time Out: 1315 Time Total: 75 min.   Visit consisted of counseling and education dealing with the complex and emotionally intense issues of symptom management and palliative care in the setting of serious and potentially life-threatening illness.Greater than 50%  of this time was spent counseling and coordinating care related to the above assessment and plan.  Signed by:  Alda Lea, AGPCNP-BC Palliative Medicine Team  Phone: [phone] Fax: [phone] Pager: [phone] Amion: Bjorn Pippin

## 2018-12-19 NOTE — ED Provider Notes (Signed)
Stroke admit. Dense right hemiparesis and aphasia, baseline normal verbal interaction and wheelchair bound. DNR. Physical Exam  BP (!) 130/110   Pulse 68   Temp 98.4 F (36.9 C) (Rectal)   Resp (!) 24   SpO2 93%   Physical Exam  ED Course/Procedures     Procedures  MDM         Charlesetta Shanks, MD 2018/11/21 [phone]

## 2018-12-19 NOTE — H&P (Signed)
History and Physical    Diana Anthony:[id number] DOB: [birth year] DOA: 12-12-18  Referring MD/NP/PA: Lissa Morales, MD PCP: System, Pcp Not In  Patient coming from: [facility] via EMS  Chief Complaint: Altered mental status  I have personally briefly reviewed patient's old medical records in [place]   HPI: Diana Anthony is a 85 y.o. female with medical history significant of hypertension, hypothyroidism, osteoporosis, breast cancer, and anxiety; who presents after being found acutely altered.  Previously, noted to be wheelchair-bound and had last talked to her son yesterday afternoon.  Patient normally was talkative at baseline.  Son reports that over the last few weeks she had been having decreased oral intake and only eating breakfast.  Patient did not want any significant life prolonging measures and he wants her to be comfortable.  ED Course: Patient presented as a code stroke.  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  Neurology was consulted, but patient not a candidate for any acute intervention.  Upon admission into the emergency department patient was noted to be afebrile, pulse 50-71, respirations 16-27, normal limits.  Labs revealed WBC 6.4, hemoglobin 11.2, BUN 78, and creatinine 3.48 (baseline previously 1.6-1.8).  TRH was called to admit for suspected stroke.  Review of Systems  Unable to perform ROS: Patient nonverbal    Past Medical History:  Diagnosis Date  . Anxiety state, unspecified   . Dysthymic disorder   . First degree atrioventricular block   . Hyperglycemia 01/30/09  . Malignant neoplasm of breast (female), unspecified site   . Osteoarthrosis, unspecified whether generalized or localized, unspecified site   . Senile osteoporosis   . Trigger finger (acquired)   . Unspecified constipation   . Unspecified essential hypertension   .  Unspecified hypothyroidism   . Unspecified vitamin D deficiency     Past Surgical History:  Procedure Laterality Date  . CATARACT EXTRACTION BILATERAL W/ ANTERIOR VITRECTOMY Bilateral 2002  . MASTECTOMY Left 1986   Dr.Weatherly   . ORIF FACIAL FRACTURE  1962     reports that she has never smoked. She has never used smokeless tobacco. She reports that she does not drink alcohol or use drugs.  Allergies  Allergen Reactions  . Macrobid [Nitrofurantoin Macrocrystal] Other (See Comments)    unknown    Family History  Problem Relation Age of Onset  . Cerebrovascular Accident Mother   . Hypertension Mother   . Cerebrovascular Accident Father   . Heart disease Sister   . Emphysema Brother     Prior to Admission medications   Medication Sig Start Date End Date Taking? Authorizing Provider  acetaminophen (TYLENOL) 500 MG tablet Take 2 tablets (1,000 mg total) by mouth every 6 (six) hours as needed. Patient taking differently: Take 1,000 mg by mouth every 6 (six) hours as needed for mild pain.  05/27/17  Yes Charlesetta Shanks, MD  amLODipine-benazepril (LOTREL) 10-20 MG capsule TAKE 1 CAPSULE BY MOUTH DAILY 04/23/17  Yes Reed, Tiffany L, DO  aspirin 81 MG chewable tablet Chew 81 mg by mouth daily.   Yes [provider]  furosemide (LASIX) 20 MG tablet Take 20 mg by mouth daily.   Yes [provider]  guaiFENesin-dextromethorphan (ROBITUSSIN DM) 100-10 MG/5ML syrup Take 10 mLs by mouth every 6 (six) hours as needed for cough.   Yes [provider]  hydrALAZINE (APRESOLINE) 25 MG tablet TAKE 1 TABLET(25 MG)  BY MOUTH DAILY Patient taking differently: Take 25 mg by mouth daily.  06/10/17  Yes Reed, Tiffany L, DO  levothyroxine (SYNTHROID, LEVOTHROID) 100 MCG tablet Take 1 tablet (100 mcg total) by mouth daily. 02/20/17  Yes Reed, Tiffany L, DO  metoprolol succinate (TOPROL-XL) 50 MG 24 hr tablet TAKE 1 TABLET BY MOUTH DAILY TO CONTROL BLOOD PRESSURE AND HEART RHYTHM.(TAKE  WITH OR IMMEDIATELY FOLLOWING A MEAL) Patient taking differently: Take 50 mg by mouth daily.  04/09/17  Yes Blanchie Serve, MD  Vitamin D, Ergocalciferol, (DRISDOL) 1.25 MG (50000 UT) CAPS capsule Take 50,000 Units by mouth every Friday.   Yes [provider]  aspirin EC 81 MG tablet Take 1 tablet (81 mg total) by mouth daily. Patient not taking: Reported on December 12, 2018 10/28/16   Lauree Chandler, NP  traMADol (ULTRAM) 50 MG tablet 2 tablets every 6 hours as needed for pain.  You may take with Tylenol. Patient not taking: Reported on 12/12/18 06/03/17   Patrecia Pour, Christean Grief, MD    Physical Exam:  Constitutional: Elderly female who is currently snoring and not arousable, but we will move left side to stimuli Vitals:   Dec 12, 2018 0715 Dec 12, 2018 0730 2018-12-12 0745 12-12-2018 0800  BP: 130/62 137/72 131/71 130/66  Pulse: 71 69 (!) 55 68  Resp: 18 (!) 24 16 (!) 24  Temp:      TempSrc:      SpO2: 93% 95% 94% 92%   Eyes: PERRL, lids and conjunctivae normal ENMT: Mucous membranes are dry posterior pharynx clear of any exudate or lesions   Neck: normal, supple, no masses, no thyromegaly Respiratory: Decreased overall aeration with no significant wheezes or rhonchi.  Cardiovascular: Regular rate with intermittent extra beats.  No significant lower extremity edema. 2+ pedal pulses. No carotid bruits.  Abdomen: no tenderness, no masses palpated. No hepatosplenomegaly. Bowel sounds positive.  Musculoskeletal: no clubbing / cyanosis. No joint deformity upper and lower extremities. Good ROM, no contractures. Normal muscle tone.  Skin: no rashes, lesions, ulcers. No induration Neurologic: Patient able to move left upper and lower extremity spontaneously at home.  Right-sided weakness appreciated Psychiatric: Patient not arousable at this time to assess.    Labs on Admission: I have personally reviewed following labs and imaging studies  CBC: Recent Labs  Lab 12/12/18 0648  WBC 6.4   NEUTROABS 3.8  HGB 11.2*  HCT 35.7*  MCV 95.2  PLT 341   Basic Metabolic Panel: Recent Labs  Lab 12-12-18 0648  NA 136  K 3.7  CL 103  CO2 17*  GLUCOSE 101*  BUN 78*  CREATININE 3.48*  CALCIUM 9.5   GFR: CrCl cannot be calculated (Unknown ideal weight.). Liver Function Tests: Recent Labs  Lab 12/12/2018 0648  AST 19  ALT 12  ALKPHOS 54  BILITOT 0.4  PROT 6.9  ALBUMIN 3.4*   No results for input(s): LIPASE, AMYLASE in the last 168 hours. No results for input(s): AMMONIA in the last 168 hours. Coagulation Profile: Recent Labs  Lab 12-12-2018 0648  INR 1.2   Cardiac Enzymes: No results for input(s): CKTOTAL, CKMB, CKMBINDEX, TROPONINI in the last 168 hours. BNP (last 3 results) No results for input(s): PROBNP in the last 8760 hours. HbA1C: No results for input(s): HGBA1C in the last 72 hours. CBG: No results for input(s): GLUCAP in the last 168 hours. Lipid Profile: No results for input(s): CHOL, HDL, LDLCALC, TRIG, CHOLHDL, LDLDIRECT in the last 72 hours. Thyroid Function Tests: No results for input(s): TSH,  T4TOTAL, FREET4, T3FREE, THYROIDAB in the last 72 hours. Anemia Panel: No results for input(s): VITAMINB12, FOLATE, FERRITIN, TIBC, IRON, RETICCTPCT in the last 72 hours. Urine analysis:    Component Value Date/Time   COLORURINE YELLOW 2018/11/27 0648   APPEARANCEUR CLEAR November 27, 2018 0648   LABSPEC 1.008 11/27/18 0648   PHURINE 5.0 Nov 27, 2018 0648   GLUCOSEU NEGATIVE 11/27/2018 0648   HGBUR NEGATIVE November 27, 2018 0648   BILIRUBINUR NEGATIVE 11/27/18 0648   BILIRUBINUR negative 12/16/2012 1449   [place] 11/27/18 0648   PROTEINUR NEGATIVE 11/27/2018 0648   UROBILINOGEN negative 12/16/2012 1449   NITRITE NEGATIVE 11-27-18 0648   LEUKOCYTESUR NEGATIVE 11-27-18 0648   Sepsis Labs: No results found for this or any previous visit (from the past 240 hour(s)).   Radiological Exams on Admission: Ct Head Wo Contrast  Result Date:  27-Nov-2018 CLINICAL DATA:  Loss of consciousness EXAM: CT HEAD WITHOUT CONTRAST TECHNIQUE: Contiguous axial images were obtained from the base of the skull through the vertex without intravenous contrast. COMPARISON:  09/26/2017 FINDINGS: Brain: No evidence of acute infarction, hemorrhage, hydrocephalus, extra-axial collection or mass lesion/mass effect. Chronic small vessel ischemia with confluent low-density in the cerebral white matter. Generalized atrophy. Small remote right cerebellar infarct. Vascular: Dolichoectasia of vertebrobasilar arteries. There is a giant aneurysm from the cavernous right ICA measuring 2.5 cm, with peripheral calcification. No adjacent hemorrhage or brain edema. Skull: No acute or aggressive finding. Sinuses/Orbits: Remote left inferior orbital rim repair. Bilateral cataract resection. IMPRESSION: 1. No acute finding. 2. Atrophy and chronic small vessel ischemia. 3. Vertebrobasilar dolichoectasia and 2.5 cm right cavernous ICA aneurysm. Electronically Signed   By: Monte Fantasia M.D.   On: 11-27-18 07:36   Dg Chest Port 1 View  Result Date: November 27, 2018 CLINICAL DATA:  Altered mental status.  Possible stroke. EXAM: PORTABLE CHEST 1 VIEW COMPARISON:  09/26/2017; 05/31/2017 FINDINGS: Grossly unchanged cardiac silhouette and mediastinal contours given decreased lung volumes and patient rotation. Atherosclerotic plaque within thoracic aorta. Redemonstrated mild pulmonary is congestion without frank evidence of edema. No definite pleural effusion or pneumothorax. Minimal right basilar opacities, favored to represent atelectasis. No discrete focal airspace opacities. Surgical clips overlie the left axilla. No acute osseous abnormalities. IMPRESSION: Similar findings of pulmonary venous congestion without superimposed acute cardiopulmonary disease on this hypoventilated rotated examination. Electronically Signed   By: Sandi Mariscal M.D.   On: 2018-11-27 07:55    EKG: Independently  reviewed.  Sinus rhythm at 71 bpm with premature complexes  Assessment/Plan Expressive aphasia and right hemiparesis secondary to suspected CVA: Acute Patient presents after being found unable to arouse, nonverbal, and with limited ability to move the right upper/ lower extremity.  CT a of the head reveals right cavernous internal carotid artery aneurysm withvertebrobasilar dolicholectasia and chronic small vessel disease with atrophy.  Suspecting a significant left-sided stroke.  Neurology did not recommend further work-up given poor overall prognosis given age and chances of recovery.  - Admit to telemetry bed  - Stroke order set initiated - Neuro checks - N.p.o. with aspiration precautions - PT/OT/Speech to evaluate and treat if possible - Appreciate neurology consultative services - Palliative care consult for end-of-life discussions - Social work consult    Acute renal failure superimposed on chronic kidney disease stage IV: Patient's baseline creatinine previously noted to be around 1.6 in 2019.  But presents with creatinine elevated up to 3.48 with BUN 78.  Suspect prerenal cause of symptoms given history of decreased oral intake.  Patient son okay with IV  fluids at this time. - IV fluids 75 mL/h   Anemia of chronic disease: Hemoglobin 11.2 g/dL on mission.  Appears similar to previous in the past. - Continue to monitor if needed  Hypothyroidism: Recently noted to be uncontrolled back in 05/2017.  TSH was elevated at 12.98. -Start IV levothyroxine at family does not decide to make patient comfort care  Essential hypertension: Blood pressures 118/62-136/69.  -Allowing for permissive hypertension for suspected stroke  All oral medications on hold currently due to patient's current medical status.  DVT prophylaxis: Heparin Code Status: DNR Family Communication: Discussed plan of care with the patient's son present at bedside Disposition Plan: To be determined Consults called:  Neurology Admission status: Inpatient  Norval Morton MD [facility] Pager [phone]   If 7PM-7AM, please contact night-coverage [email or web address] Password [facility]  14-Dec-2018, 8:26 AM

## 2018-12-19 NOTE — Consult Note (Signed)
Stroke Neurology Consultation Note  Consult Requested by: Dr. Tamala Julian  Reason for Consult: stroke  Consult Date: 29-Nov-2018  The history was obtained from the son and Dr. Tamala Julian.  During history and examination, all items were not able to obtain unless otherwise noted.  History of Present Illness:  Diana Anthony is a 85 y.o. Caucasian female with PMH of HTN, dementia, hypothyroidism living in NH was found unresponsive this morning in NH by nursing staff. As per son, pt at her baseline yesterday which she is wheelchair bound but able to help for transition, able to talk and feed herself most of the time. However, this morning when NH staff try to wake her up but found she was not able to be waken up and not responsive. EMS called and pt sent to ER for evaluation.   On my encounter with her, she is obtunded, not open eyes to voice and not following any commands. Snoring and intermittent desaturation. Tele showed afib with intermittent RVR. Not moving right UE and LE. CT head no acute finding. However, she seems to have left MCA infarct. With her advanced age, poor baseline condition and this devastating infarct, I discussed with son at bedside and he requested comfort care measures to keep pt comfort at this time.   LSN: last night 8-9 pm before sleep tPA Given: No: outside window   Past Medical History:  Diagnosis Date  . Anxiety state, unspecified   . Dysthymic disorder   . First degree atrioventricular block   . Hyperglycemia 01/30/09  . Malignant neoplasm of breast (female), unspecified site   . Osteoarthrosis, unspecified whether generalized or localized, unspecified site   . Senile osteoporosis   . Trigger finger (acquired)   . Unspecified constipation   . Unspecified essential hypertension   . Unspecified hypothyroidism   . Unspecified vitamin D deficiency     Past Surgical History:  Procedure Laterality Date  . CATARACT EXTRACTION BILATERAL W/ ANTERIOR VITRECTOMY Bilateral 2002  .  MASTECTOMY Left 1986   Dr.Weatherly   . ORIF FACIAL FRACTURE  1962    Family History  Problem Relation Age of Onset  . Cerebrovascular Accident Mother   . Hypertension Mother   . Cerebrovascular Accident Father   . Heart disease Sister   . Emphysema Brother     Social History:  reports that she has never smoked. She has never used smokeless tobacco. She reports that she does not drink alcohol or use drugs.  Allergies:  Allergies  Allergen Reactions  . Macrobid [Nitrofurantoin Macrocrystal] Other (See Comments)    unknown    No current facility-administered medications on file prior to encounter.    Current Outpatient Medications on File Prior to Encounter  Medication Sig Dispense Refill  . acetaminophen (TYLENOL) 500 MG tablet Take 2 tablets (1,000 mg total) by mouth every 6 (six) hours as needed. (Patient taking differently: Take 1,000 mg by mouth every 6 (six) hours as needed for mild pain. ) 30 tablet 0  . amLODipine-benazepril (LOTREL) 10-20 MG capsule TAKE 1 CAPSULE BY MOUTH DAILY 90 capsule 1  . aspirin 81 MG chewable tablet Chew 81 mg by mouth daily.    . furosemide (LASIX) 20 MG tablet Take 20 mg by mouth daily.    Marland Kitchen guaiFENesin-dextromethorphan (ROBITUSSIN DM) 100-10 MG/5ML syrup Take 10 mLs by mouth every 6 (six) hours as needed for cough.    . hydrALAZINE (APRESOLINE) 25 MG tablet TAKE 1 TABLET(25 MG) BY MOUTH DAILY (Patient taking differently: Take  25 mg by mouth daily. ) 90 tablet 0  . levothyroxine (SYNTHROID, LEVOTHROID) 100 MCG tablet Take 1 tablet (100 mcg total) by mouth daily. 90 tablet 3  . metoprolol succinate (TOPROL-XL) 50 MG 24 hr tablet TAKE 1 TABLET BY MOUTH DAILY TO CONTROL BLOOD PRESSURE AND HEART RHYTHM.(TAKE WITH OR IMMEDIATELY FOLLOWING A MEAL) (Patient taking differently: Take 50 mg by mouth daily. ) 90 tablet 1  . Vitamin D, Ergocalciferol, (DRISDOL) 1.25 MG (50000 UT) CAPS capsule Take 50,000 Units by mouth every Friday.    Marland Kitchen aspirin EC 81 MG  tablet Take 1 tablet (81 mg total) by mouth daily. (Patient not taking: Reported on December 18, 2018)    . traMADol (ULTRAM) 50 MG tablet 2 tablets every 6 hours as needed for pain.  You may take with Tylenol. (Patient not taking: Reported on 12-18-18) 20 tablet 0    Review of Systems: A full ROS was attempted today and was not able to be performed.    Physical Examination: Temp:  [98.4 F (36.9 C)] 98.4 F (36.9 C) (07/01 2993) Pulse Rate:  [41-97] 97 (07/01 1515) Resp:  [16-34] 21 (07/01 1515) BP: (118-172)/(57-129) 155/74 (07/01 1515) SpO2:  [86 %-99 %] 88 % (07/01 1515)  General - well nourished, well developed, in acute respiratory distress.    Ophthalmologic - fundi not visualized due to noncooperation.    Cardiovascular - irregularly irregular heart rate and rhythm with intermittent RVR  Neuro - obtunded, eyes closed, not open to voice or pain. Nonverbal, not following commands. With forced eye opening, eyes midline, no gaze deviation, no significant doll's eyes. PERRL, positive corneal and gag. Right mild nasolabial fold flattening. Not blinking to visual threat bilaterally. Tongue protrusion not cooperative. Purposeful move LUE and LLE against gravity, however, 0/5 RUE with pain and 2/5 withdraw RLE with pain. No babinski bilaterally. Sensation, coordination and gait not tested.   Data Reviewed: Ct Head Wo Contrast  Result Date: 12-18-18 CLINICAL DATA:  Loss of consciousness EXAM: CT HEAD WITHOUT CONTRAST TECHNIQUE: Contiguous axial images were obtained from the base of the skull through the vertex without intravenous contrast. COMPARISON:  09/26/2017 FINDINGS: Brain: No evidence of acute infarction, hemorrhage, hydrocephalus, extra-axial collection or mass lesion/mass effect. Chronic small vessel ischemia with confluent low-density in the cerebral white matter. Generalized atrophy. Small remote right cerebellar infarct. Vascular: Dolichoectasia of vertebrobasilar arteries. There is a  giant aneurysm from the cavernous right ICA measuring 2.5 cm, with peripheral calcification. No adjacent hemorrhage or brain edema. Skull: No acute or aggressive finding. Sinuses/Orbits: Remote left inferior orbital rim repair. Bilateral cataract resection. IMPRESSION: 1. No acute finding. 2. Atrophy and chronic small vessel ischemia. 3. Vertebrobasilar dolichoectasia and 2.5 cm right cavernous ICA aneurysm. Electronically Signed   By: Monte Fantasia M.D.   On: 12/18/18 07:36   Dg Chest Port 1 View  Result Date: 12/18/2018 CLINICAL DATA:  Altered mental status.  Possible stroke. EXAM: PORTABLE CHEST 1 VIEW COMPARISON:  09/26/2017; 05/31/2017 FINDINGS: Grossly unchanged cardiac silhouette and mediastinal contours given decreased lung volumes and patient rotation. Atherosclerotic plaque within thoracic aorta. Redemonstrated mild pulmonary is congestion without frank evidence of edema. No definite pleural effusion or pneumothorax. Minimal right basilar opacities, favored to represent atelectasis. No discrete focal airspace opacities. Surgical clips overlie the left axilla. No acute osseous abnormalities. IMPRESSION: Similar findings of pulmonary venous congestion without superimposed acute cardiopulmonary disease on this hypoventilated rotated examination. Electronically Signed   By: Sandi Mariscal M.D.   On: 12/18/18 07:55  Assessment: 85 y.o. female with PMH of HTN, dementia, hypothyroidism living in NH was found unresponsive this morning in NH by nursing staff. Exam concerning for left MCA syndrome, obtunded, nonverbal, right hemiplegia. CT head no acute finding. She most likely had left MCA infarct. Etiology likely due to PAF not on AC. With her advanced age, poor baseline condition and this devastating infarct, I discussed with son at bedside and he requested comfort care measures to keep pt comfort at this time. I text paged Dr. Tamala Julian and discussed with [facility] team and will start with comfort care  measures.  Plan: - likely left MCA infarct, etiology due to PAF not on [facility] - given her advanced age, poor baseline condition and this devastating infarct, her prognosis is poor. I discussed with son at bedside and he requested comfort care measures to keep pt comfort at this time. - discussed with PCM, will start comfort care measures. - neurology will sign off for now, please call with further questions.  Thank you for this consultation and allowing [place] to participate in the care of this patient.  Rosalin Hawking, MD PhD Stroke Neurology 2018-12-18 3:45 PM

## 2018-12-19 DEATH — deceased

## 2019-12-13 IMAGING — DX DG HIP (WITH OR WITHOUT PELVIS) 2-3V*R*
3 series · 3 of 3 positions shown · non-contrast
Comparison: Pelvic x-ray May 30, 2017

CLINICAL DATA: Status post fall on [REDACTED] with intense pain of the
right hip.

EXAM:
DG HIP (WITH OR WITHOUT PELVIS) 2-3V RIGHT

[t pelvis ap]
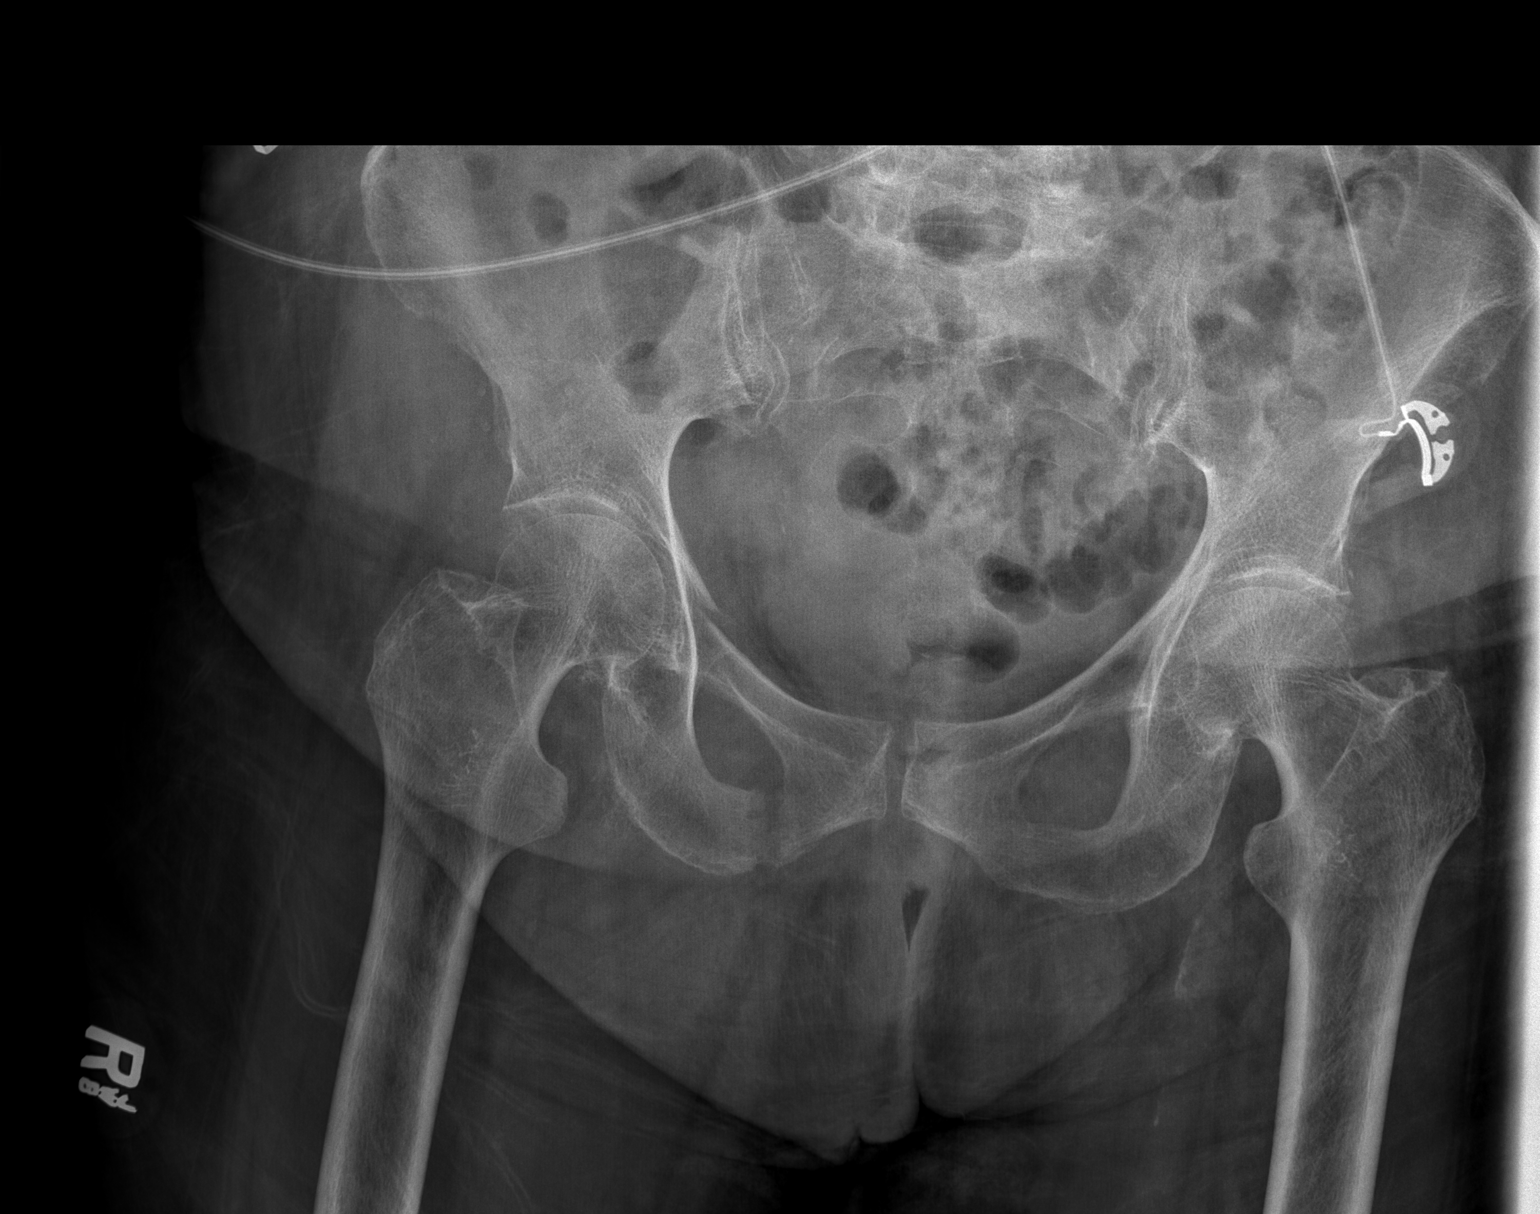

[t hip ap right]
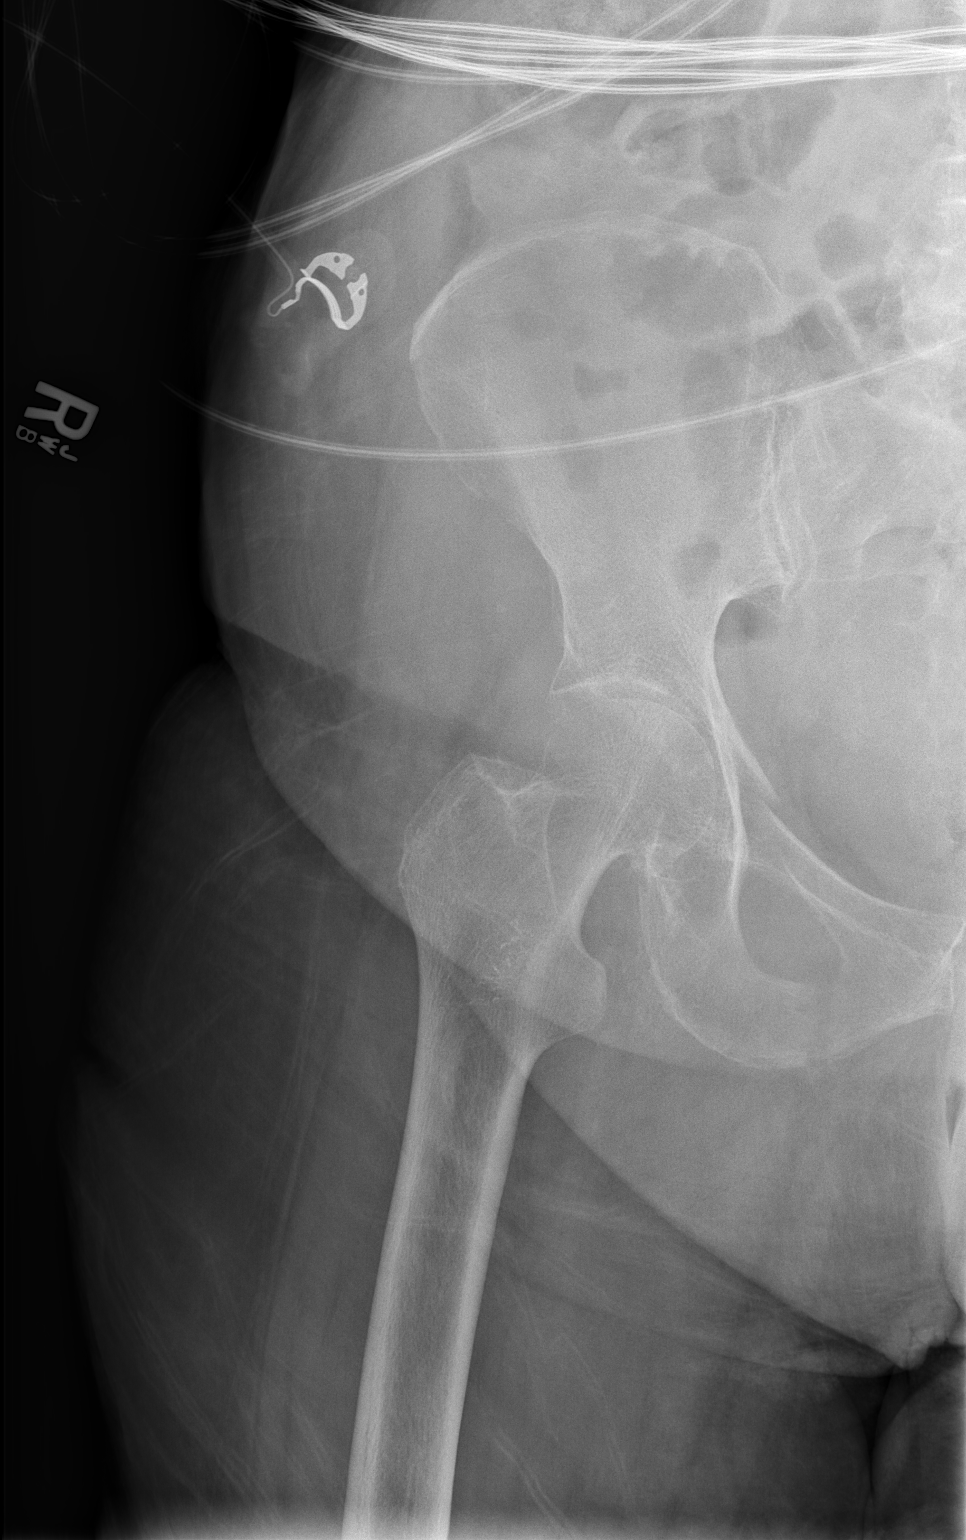

[w hip lat right]
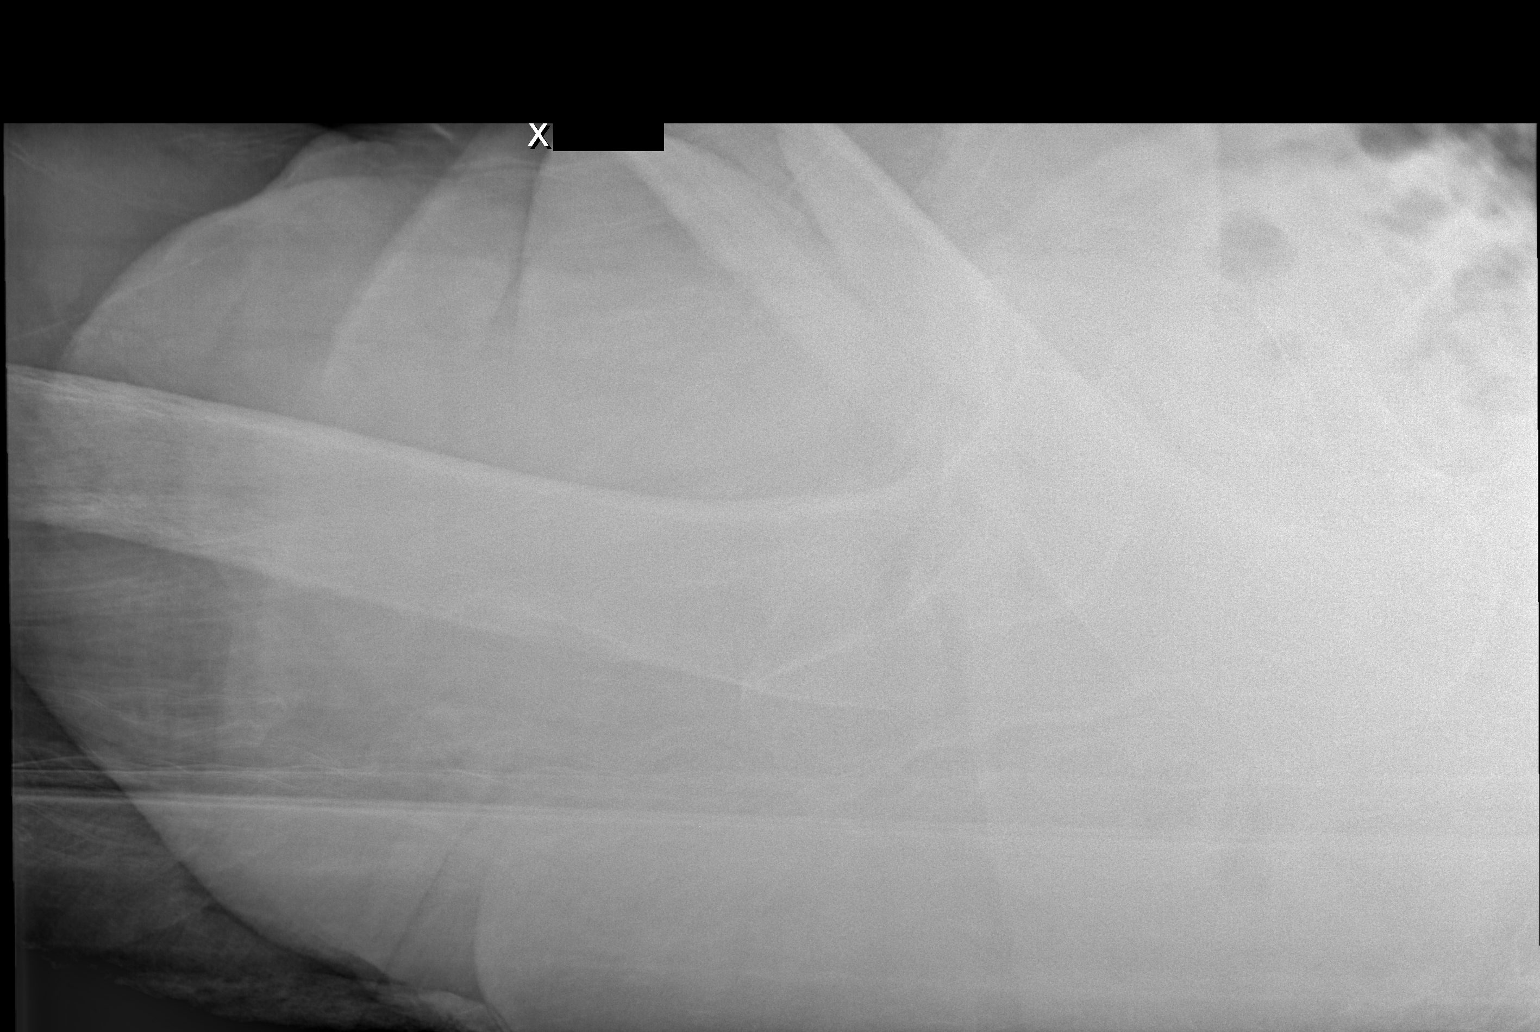

[3 of 3 positions shown; findings below may reference images not displayed]

FINDINGS: There are fractures at the right superior and inferior pubic rami
unchanged compared prior exam. No other acute fracture or
dislocation is identified.
IMPRESSION: Fractures of the right superior and inferior pubic rami unchanged
compared prior pelvic x-ray of May 30, 2017.
